# Patient Record
Sex: Male | Born: 1970 | Race: White | Hispanic: No | Marital: Single | Smoking: Current every day smoker
Health system: Southern US, Community
[De-identification: ages and names within clinical notes are randomized; demographics above are authoritative.]

## PROBLEM LIST (undated history)

## (undated) DIAGNOSIS — N179 Acute kidney failure, unspecified: Secondary | ICD-10-CM

## (undated) DIAGNOSIS — K7010 Alcoholic hepatitis without ascites: Secondary | ICD-10-CM

## (undated) DIAGNOSIS — R601 Generalized edema: Secondary | ICD-10-CM

## (undated) DIAGNOSIS — I4891 Unspecified atrial fibrillation: Secondary | ICD-10-CM

## (undated) HISTORY — PX: OTHER SURGICAL HISTORY: SHX169

---

## 1997-10-23 ENCOUNTER — Ambulatory Visit (HOSPITAL_COMMUNITY): Admission: RE | Admit: 1997-10-23 | Discharge: 1997-10-23 | Payer: Self-pay | Admitting: Family Medicine

## 1998-06-07 ENCOUNTER — Emergency Department (HOSPITAL_COMMUNITY): Admission: EM | Admit: 1998-06-07 | Discharge: 1998-06-07 | Payer: Self-pay | Admitting: Emergency Medicine

## 1998-06-07 ENCOUNTER — Encounter: Payer: Self-pay | Admitting: Emergency Medicine

## 1999-03-11 ENCOUNTER — Emergency Department (HOSPITAL_COMMUNITY): Admission: EM | Admit: 1999-03-11 | Discharge: 1999-03-11 | Payer: Self-pay | Admitting: *Deleted

## 2001-10-22 ENCOUNTER — Emergency Department (HOSPITAL_COMMUNITY): Admission: EM | Admit: 2001-10-22 | Discharge: 2001-10-22 | Payer: Self-pay | Admitting: Emergency Medicine

## 2002-04-12 ENCOUNTER — Emergency Department (HOSPITAL_COMMUNITY): Admission: EM | Admit: 2002-04-12 | Discharge: 2002-04-12 | Payer: Self-pay | Admitting: Emergency Medicine

## 2005-12-30 ENCOUNTER — Emergency Department (HOSPITAL_COMMUNITY): Admission: EM | Admit: 2005-12-30 | Discharge: 2005-12-30 | Payer: Self-pay | Admitting: Emergency Medicine

## 2006-11-09 ENCOUNTER — Emergency Department (HOSPITAL_COMMUNITY): Admission: EM | Admit: 2006-11-09 | Discharge: 2006-11-09 | Payer: Self-pay | Admitting: Emergency Medicine

## 2010-05-05 ENCOUNTER — Emergency Department (HOSPITAL_COMMUNITY)
Admission: EM | Admit: 2010-05-05 | Discharge: 2010-05-05 | Payer: Self-pay | Source: Home / Self Care | Admitting: Emergency Medicine

## 2010-07-15 LAB — POCT I-STAT, CHEM 8
Calcium, Ion: 0.82 mmol/L — ABNORMAL LOW (ref 1.12–1.32)
Creatinine, Ser: 1.1 mg/dL (ref 0.4–1.5)
Glucose, Bld: 100 mg/dL — ABNORMAL HIGH (ref 70–99)
Hemoglobin: 16.3 g/dL (ref 13.0–17.0)
TCO2: 18 mmol/L (ref 0–100)

## 2010-07-15 LAB — PROTIME-INR
INR: 0.99 (ref 0.00–1.49)
Prothrombin Time: 13.3 seconds (ref 11.6–15.2)

## 2010-07-15 LAB — COMPREHENSIVE METABOLIC PANEL
BUN: 10 mg/dL (ref 6–23)
Calcium: 8.8 mg/dL (ref 8.4–10.5)
Glucose, Bld: 102 mg/dL — ABNORMAL HIGH (ref 70–99)
Sodium: 138 mEq/L (ref 135–145)
Total Protein: 6.9 g/dL (ref 6.0–8.3)

## 2010-07-15 LAB — TYPE AND SCREEN: Antibody Screen: NEGATIVE

## 2010-07-15 LAB — URINALYSIS, ROUTINE W REFLEX MICROSCOPIC
Bilirubin Urine: NEGATIVE
Ketones, ur: NEGATIVE mg/dL
Protein, ur: NEGATIVE mg/dL
Urobilinogen, UA: 0.2 mg/dL (ref 0.0–1.0)

## 2010-07-15 LAB — CBC
HCT: 46.8 % (ref 39.0–52.0)
MCHC: 34.6 g/dL (ref 30.0–36.0)
MCV: 96.7 fL (ref 78.0–100.0)
Platelets: 237 10*3/uL (ref 150–400)
RDW: 13 % (ref 11.5–15.5)

## 2010-07-15 LAB — URINE MICROSCOPIC-ADD ON

## 2010-09-08 ENCOUNTER — Emergency Department (HOSPITAL_COMMUNITY)
Admission: EM | Admit: 2010-09-08 | Discharge: 2010-09-08 | Disposition: A | Discharge status: Home / Self Care | Payer: Self-pay | Attending: Emergency Medicine | Admitting: Emergency Medicine

## 2010-09-08 DIAGNOSIS — L0501 Pilonidal cyst with abscess: Secondary | ICD-10-CM | POA: Insufficient documentation

## 2011-02-18 LAB — DIFFERENTIAL
Basophils Absolute: 0
Lymphocytes Relative: 35
Monocytes Absolute: 0.5
Neutro Abs: 5.2

## 2011-02-18 LAB — POCT CARDIAC MARKERS
CKMB, poc: <1 — ABNORMAL LOW
Troponin i, poc: <0.05

## 2011-02-18 LAB — CBC
Hemoglobin: 16.5
RBC: 4.92
RDW: 14.4 — ABNORMAL HIGH

## 2011-02-18 LAB — ACETAMINOPHEN LEVEL: Acetaminophen (Tylenol), Serum: 12.4

## 2011-02-18 LAB — ETHANOL: Alcohol, Ethyl (B): 122 — ABNORMAL HIGH

## 2013-03-13 ENCOUNTER — Emergency Department (HOSPITAL_COMMUNITY)
Admission: EM | Admit: 2013-03-13 | Discharge: 2013-03-14 | Disposition: A | Discharge status: Home / Self Care | Payer: Self-pay | Attending: Emergency Medicine | Admitting: Emergency Medicine

## 2013-03-13 ENCOUNTER — Encounter (HOSPITAL_COMMUNITY): Payer: Self-pay | Admitting: Emergency Medicine

## 2013-03-13 DIAGNOSIS — F1110 Opioid abuse, uncomplicated: Secondary | ICD-10-CM | POA: Insufficient documentation

## 2013-03-13 DIAGNOSIS — F17200 Nicotine dependence, unspecified, uncomplicated: Secondary | ICD-10-CM | POA: Insufficient documentation

## 2013-03-13 DIAGNOSIS — I4891 Unspecified atrial fibrillation: Secondary | ICD-10-CM | POA: Insufficient documentation

## 2013-03-13 DIAGNOSIS — F1010 Alcohol abuse, uncomplicated: Secondary | ICD-10-CM | POA: Insufficient documentation

## 2013-03-13 DIAGNOSIS — F3289 Other specified depressive episodes: Secondary | ICD-10-CM | POA: Insufficient documentation

## 2013-03-13 DIAGNOSIS — F329 Major depressive disorder, single episode, unspecified: Secondary | ICD-10-CM | POA: Insufficient documentation

## 2013-03-13 DIAGNOSIS — F1910 Other psychoactive substance abuse, uncomplicated: Secondary | ICD-10-CM

## 2013-03-13 DIAGNOSIS — Z79899 Other long term (current) drug therapy: Secondary | ICD-10-CM | POA: Insufficient documentation

## 2013-03-13 HISTORY — DX: Unspecified atrial fibrillation: I48.91

## 2013-03-13 LAB — CBC
MCH: 37.2 pg — ABNORMAL HIGH (ref 26.0–34.0)
MCHC: 35.6 g/dL (ref 30.0–36.0)
Platelets: 193 10*3/uL (ref 150–400)
RBC: 4.84 MIL/uL (ref 4.22–5.81)
RDW: 15.3 % (ref 11.5–15.5)

## 2013-03-13 LAB — COMPREHENSIVE METABOLIC PANEL
ALT: 53 U/L (ref 0–53)
AST: 54 U/L — ABNORMAL HIGH (ref 0–37)
Albumin: 4.2 g/dL (ref 3.5–5.2)
Calcium: 9.9 mg/dL (ref 8.4–10.5)
Sodium: 136 mEq/L (ref 135–145)
Total Protein: 7.8 g/dL (ref 6.0–8.3)

## 2013-03-13 LAB — RAPID URINE DRUG SCREEN, HOSP PERFORMED
Amphetamines: NOT DETECTED
Cocaine: NOT DETECTED
Opiates: NOT DETECTED
Tetrahydrocannabinol: NOT DETECTED

## 2013-03-13 MED ORDER — HYDROXYZINE HCL 25 MG PO TABS: 25.0000 mg | ORAL_TABLET | Freq: Four times a day (QID) | ORAL | Status: DC | PRN

## 2013-03-13 MED ORDER — TRAZODONE HCL 50 MG PO TABS
50.0000 mg | ORAL_TABLET | Freq: Every evening | ORAL | Status: DC | PRN
  Administered 2013-03-13: 50 mg via ORAL
  Filled 2013-03-13: qty 1

## 2013-03-13 MED ORDER — LORAZEPAM 1 MG PO TABS: 0.0000 mg | ORAL_TABLET | Freq: Two times a day (BID) | ORAL | Status: DC

## 2013-03-13 MED ORDER — ONDANSETRON HCL 4 MG PO TABS: 4.0000 mg | ORAL_TABLET | Freq: Three times a day (TID) | ORAL | Status: DC | PRN

## 2013-03-13 MED ORDER — CHLORDIAZEPOXIDE HCL 25 MG PO CAPS
25.0000 mg | ORAL_CAPSULE | Freq: Once | ORAL | Status: AC
  Administered 2013-03-13: 25 mg via ORAL
  Filled 2013-03-13: qty 1

## 2013-03-13 MED ORDER — THIAMINE HCL 100 MG/ML IJ SOLN
100.0000 mg | Freq: Once | INTRAMUSCULAR | Status: AC
  Administered 2013-03-13: 100 mg via INTRAMUSCULAR
  Filled 2013-03-13: qty 2

## 2013-03-13 MED ORDER — ONDANSETRON 4 MG PO TBDP: 4.0000 mg | ORAL_TABLET | Freq: Four times a day (QID) | ORAL | Status: DC | PRN

## 2013-03-13 MED ORDER — IBUPROFEN 200 MG PO TABS: 600.0000 mg | ORAL_TABLET | Freq: Three times a day (TID) | ORAL | Status: DC | PRN

## 2013-03-13 MED ORDER — VITAMIN B-1 100 MG PO TABS
100.0000 mg | ORAL_TABLET | Freq: Every day | ORAL | Status: DC
  Administered 2013-03-14: 10:00:00 100 mg via ORAL
  Filled 2013-03-13: qty 1

## 2013-03-13 MED ORDER — CHLORDIAZEPOXIDE HCL 25 MG PO CAPS: 25.0000 mg | ORAL_CAPSULE | Freq: Four times a day (QID) | ORAL | Status: DC | PRN

## 2013-03-13 MED ORDER — SERTRALINE HCL 50 MG PO TABS
25.0000 mg | ORAL_TABLET | Freq: Every day | ORAL | Status: DC
  Administered 2013-03-13 – 2013-03-14 (×2): 25 mg via ORAL
  Filled 2013-03-13 (×2): qty 1

## 2013-03-13 MED ORDER — CHLORDIAZEPOXIDE HCL 25 MG PO CAPS
25.0000 mg | ORAL_CAPSULE | Freq: Three times a day (TID) | ORAL | Status: DC
  Filled 2013-03-13: qty 1

## 2013-03-13 MED ORDER — NICOTINE 21 MG/24HR TD PT24
21.0000 mg | MEDICATED_PATCH | Freq: Every day | TRANSDERMAL | Status: DC
  Administered 2013-03-13 – 2013-03-14 (×2): 21 mg via TRANSDERMAL
  Filled 2013-03-13 (×2): qty 1

## 2013-03-13 MED ORDER — METOPROLOL TARTRATE 25 MG PO TABS
50.0000 mg | ORAL_TABLET | Freq: Two times a day (BID) | ORAL | Status: DC
Start: 2013-03-13 — End: 2013-03-14
  Administered 2013-03-13 – 2013-03-14 (×2): 50 mg via ORAL
  Filled 2013-03-13 (×2): qty 2

## 2013-03-13 MED ORDER — CLONIDINE HCL 0.1 MG PO TABS
0.1000 mg | ORAL_TABLET | Freq: Four times a day (QID) | ORAL | Status: DC
  Administered 2013-03-13 – 2013-03-14 (×3): 0.1 mg via ORAL
  Filled 2013-03-13 (×3): qty 1

## 2013-03-13 MED ORDER — CHLORDIAZEPOXIDE HCL 25 MG PO CAPS: 25.0000 mg | ORAL_CAPSULE | ORAL | Status: DC

## 2013-03-13 MED ORDER — CLONIDINE HCL 0.1 MG PO TABS: 0.1000 mg | ORAL_TABLET | ORAL | Status: DC

## 2013-03-13 MED ORDER — LOPERAMIDE HCL 2 MG PO CAPS: 2.0000 mg | ORAL_CAPSULE | ORAL | Status: DC | PRN

## 2013-03-13 MED ORDER — CHLORDIAZEPOXIDE HCL 25 MG PO CAPS: 25.0000 mg | ORAL_CAPSULE | Freq: Every day | ORAL | Status: DC

## 2013-03-13 MED ORDER — LORAZEPAM 1 MG PO TABS
0.0000 mg | ORAL_TABLET | Freq: Four times a day (QID) | ORAL | Status: DC
  Administered 2013-03-13: 1 mg via ORAL
  Filled 2013-03-13: qty 1

## 2013-03-13 MED ORDER — CLONIDINE HCL 0.1 MG PO TABS: 0.1000 mg | ORAL_TABLET | Freq: Every day | ORAL | Status: DC

## 2013-03-13 MED ORDER — ACETAMINOPHEN 325 MG PO TABS: 650.0000 mg | ORAL_TABLET | ORAL | Status: DC | PRN

## 2013-03-13 MED ORDER — METHOCARBAMOL 500 MG PO TABS: 500.0000 mg | ORAL_TABLET | Freq: Three times a day (TID) | ORAL | Status: DC | PRN

## 2013-03-13 MED ORDER — ALUM & MAG HYDROXIDE-SIMETH 200-200-20 MG/5ML PO SUSP: 30.0000 mL | ORAL | Status: DC | PRN

## 2013-03-13 MED ORDER — CHLORDIAZEPOXIDE HCL 25 MG PO CAPS
25.0000 mg | ORAL_CAPSULE | Freq: Four times a day (QID) | ORAL | Status: AC
  Administered 2013-03-13 – 2013-03-14 (×4): 25 mg via ORAL
  Filled 2013-03-13 (×3): qty 1

## 2013-03-13 MED ORDER — ADULT MULTIVITAMIN W/MINERALS CH
1.0000 | ORAL_TABLET | Freq: Every day | ORAL | Status: DC
  Administered 2013-03-13 – 2013-03-14 (×2): 1 via ORAL
  Filled 2013-03-13 (×2): qty 1

## 2013-03-13 MED ORDER — NAPROXEN 500 MG PO TABS: 500.0000 mg | ORAL_TABLET | Freq: Two times a day (BID) | ORAL | Status: DC | PRN

## 2013-03-13 MED ORDER — DICYCLOMINE HCL 20 MG PO TABS: 20.0000 mg | ORAL_TABLET | Freq: Four times a day (QID) | ORAL | Status: DC | PRN

## 2013-03-13 NOTE — Consult Note (Signed)
  Subjective:  Patient states that he has depression related to his alcohol and pain killer (pill) use.  Patient states that he wants detox and then long term rehab treatment. Patient states that he has been drinking heavily for the last 6 years (one 12 pK beer and 1/2 fifth liquor a day and pain killers when ever he can get them off of the street daily).  Patient states that he last drink of alcohol was yesterday and pain killer today.  Patient denies suicidal/homicidal ideation, psychosis, and paranoia. States the only paranoia he has is being caught with "the stuff on me."  Patient states that he has never been in rehab, hospitalization, or outpatient serves for behavioral/psych behavior or illness.  Patient is living with his brother in  [place] and family is supportive.  Patient states that he was also on Zoloft once prescribed by his primary physician.  States that he has been off of the Zoloft for 6 months because he chose to stop taking it "but know I know that it was working and would like to restart it."   Psychiatric Specialty Exam: Physical Exam  Review of Systems  Constitutional: Positive for chills and diaphoresis.  Neurological: Positive for tremors and weakness.  Psychiatric/Behavioral: Positive for depression.    Blood pressure 112/62, pulse 70, temperature 98.7 F (37.1 C), temperature source Oral, resp. rate 16, SpO2 97.00%.There is no height or weight on file to calculate BMI.  General Appearance: Casual  Eye Contact::  Good  Speech:  Clear and Coherent and Normal Rate  Volume:  Normal  Mood:  Anxious and Depressed  Affect:  Congruent and Depressed  Thought Process:  Circumstantial and Goal Directed  Orientation:  Full (Time, Place, and Person)  Thought Content:  "I want to get off of the pills and alcohol"  Suicidal Thoughts:  No  Homicidal Thoughts:  No  Memory:  Immediate;   Good Recent;   Good  Judgement:  Impaired  Insight:  Fair  Psychomotor Activity:   Tremor and Diaphoresis, shakes, abdomen cramps, and fatigue  Concentration:  Fair  Recall:  Fair  Akathisia:  No  Handed:  Right  AIMS (if indicated):     Assets:  Communication Skills Desire for Improvement Housing Social Support Transportation  Sleep:      Face to face interview and consulted with Dr. Gilmore Laroche  Disposition:  Inpatient detox treatment.  Start Librium and Clonidine detox withdrawal protocol. Start Zoloft 25 mg daily. Monitor for safety and stabilization until inpatient bed is found.  Refer to long term rehab, CD IOP and give resources when patient is ready for discharge.  Shuvon B. Rankin FNP-BC Family Nurse Practitioner, Board Certified I agreed with the findings, treatment and disposition plan of this patient. Thresa Ross, MD

## 2013-03-13 NOTE — ED Provider Notes (Signed)
CSN: [id number]     Arrival date & time 03/13/13  1008 History   First MD Initiated Contact with Patient 03/13/13 1136     Chief Complaint  Patient presents with  . Addiction Problem   (Consider location/radiation/quality/duration/timing/severity/associated sxs/prior Treatment) The history is provided by the patient.   patient here requesting detox from opiates and alcohol. Admits to using Percocet and last used 2 days ago. He drinks a 12 pack of beer per day and last used yesterday. Denies any suicidal or homicidal ideations. No abdominal pain. No black or bloody stools. No command hallucinations. No chest pain or shortness of breath. Denies any prior history of detox  Past Medical History  Diagnosis Date  . Atrial fibrillation    Past Surgical History  Procedure Laterality Date  . Arm surgery     No family history on file. History  Substance Use Topics  . Smoking status: Current Every Day Smoker -- 1.00 packs/day    Types: Cigarettes  . Smokeless tobacco: Not on file  . Alcohol Use: 50.4 oz/week    84 Cans of beer per week     Comment: 12 pk per day    Review of Systems  All other systems reviewed and are negative.    Allergies  Review of patient's allergies indicates no known allergies.  Home Medications   Current Outpatient Rx  Name  Route  Sig  Dispense  Refill  . metoprolol (LOPRESSOR) 50 MG tablet   Oral   Take 50 mg by mouth 2 (two) times daily.          BP 115/79  Pulse 84  Temp(Src) 97.9 F (36.6 C) (Oral)  Resp 18  SpO2 95% Physical Exam  Nursing note and vitals reviewed. Constitutional: He is oriented to person, place, and time. He appears well-developed and well-nourished.  Non-toxic appearance. No distress.  HENT:  Head: Normocephalic and atraumatic.  Eyes: Conjunctivae, EOM and lids are normal. Pupils are equal, round, and reactive to light.  Neck: Normal range of motion. Neck supple. No tracheal deviation present. No mass present.   Cardiovascular: Normal rate, regular rhythm and normal heart sounds.  Exam reveals no gallop.   No murmur heard. Pulmonary/Chest: Effort normal and breath sounds normal. No stridor. No respiratory distress. He has no decreased breath sounds. He has no wheezes. He has no rhonchi. He has no rales.  Abdominal: Soft. Normal appearance and bowel sounds are normal. He exhibits no distension. There is no tenderness. There is no rebound and no CVA tenderness.  Musculoskeletal: Normal range of motion. He exhibits no edema and no tenderness.  Neurological: He is alert and oriented to person, place, and time. He has normal strength. No cranial nerve deficit or sensory deficit. GCS eye subscore is 4. GCS verbal subscore is 5. GCS motor subscore is 6.  Skin: Skin is warm and dry. No abrasion and no rash noted.  Psychiatric: He has a normal mood and affect. His speech is normal and behavior is normal. He expresses no suicidal plans and no homicidal plans.    ED Course  Procedures (including critical care time) Labs Review Labs Reviewed  ACETAMINOPHEN LEVEL  CBC  COMPREHENSIVE METABOLIC PANEL  ETHANOL  SALICYLATE LEVEL  URINE RAPID DRUG SCREEN (HOSP PERFORMED)   Imaging Review No results found.  EKG Interpretation   None       MDM  No diagnosis found. Patient to be medically cleared and then seen by behavior health hospital  Toy Baker, MD 03/13/13 [phone]

## 2013-03-13 NOTE — ED Notes (Addendum)
Pt states he has been using prescription pain medications on a daily basis for 3 years.  Pt states he uses oxycodone, vicodin and "pretty much anything I can get my hands on."  Pt doesn't have an rx for these meds.  Pt's last use of these medications was @ 2 am on Saturday.  Pt denies N/V/D, but endorses body aches/pains and nasal congestion.  Pt denies SI/HI.  Pt also states he uses alcohol daily. His last intake was Friday past (03/11/13).

## 2013-03-13 NOTE — ED Notes (Addendum)
Pt from home requesting detox from rx'd opiates. Pt reports that he takes approx 8 pills with ETOH. Pt last took pills/ETOH Friday. Pt is having stomach cramps, chills, loss of appetite, tremors. Pt has hx of afib and has not been taking his xarelto, but has been taking metoprolol to control rate.  Pt is A&O and in NAD

## 2013-03-13 NOTE — ED Notes (Signed)
Still unable to give report to floor. Will continue to check back.

## 2013-03-13 NOTE — ED Notes (Signed)
Pt. Is unable to provided any at this time. Will check back again

## 2013-03-13 NOTE — BH Assessment (Signed)
Assessment Note  Austin [name] is an 42 y.o. male.   Pt presents with father in ED with family in waiting room.  Pt has requested their help in getting detox from both alcohol and pain pills.  Pt primary drug is alcohol and has been using for many years on a daily basis.  Pt reports consuming 12 pk plus a half of a 5th of liquor daily.  Pt reports "I can remember the last time I went through a day not drinking."   Pt also reports that in 2011 he started taking pain pills.  "I will take whatever I can find.  Roxycotin, Oxycodone, OxyContin, Percocets, whatever. I average about 40 mg a day."    Pt denies suicidal, homicidal or audio-visual hallucination issues both presently and historically.  Pt father confirms.    Pt reports seizure hx about 2 or 3 years ago.  No Financial planner.    Pt does report depression.  When asked why he started using drugs pt responded "I am depressed.  I don't like myself and I feel better when I use."    MSE by TTS:    Pt O x 3, poor judgment, speech clear, restlessness, twitching in chair and rubbing legs and arms, light sensitive, anxious.  Pt was cooperative and polite.  He appeared in discomfort and displayed a lot of moving around in his chair but attempted to answer questions.   CIWA at 8 and COWS 4.    Recommendation  Pt has no prior hx of treatment.  Pt wants to change and has support from family to do so.  Father of pt reports no one else in family is an addict.  Pt wants to go through detox and then to rehab and then to optx services.  Pt has appeared to think through treatment process and family is supportive of pt recovery process.  Due to pt hx of seizure  [facility] and [facility] most likely won't accept pt.  [facility] appears to be an option for Pt.  Pt aware he will be in ED overnight and possible placed tomorrow.    Consulted with Dr. Freida Busman and he was in agreement with plan to assist pt and to monitor alcohol detox while in ED until pt can be placed in an  appropriate treatment facility.  Psychiatry will round.  Axis I: Alcohol Abuse, Major Depression, Recurrent severe and Substance Abuse Axis II: Deferred Axis III:  Past Medical History  Diagnosis Date  . Atrial fibrillation    Axis IV: occupational problems, other psychosocial or environmental problems, problems related to social environment and problems with primary support group Axis V: 41-50 serious symptoms  Past Medical History:  Past Medical History  Diagnosis Date  . Atrial fibrillation     Past Surgical History  Procedure Laterality Date  . Arm surgery      Family History: No family history on file.  Social History:  reports that he has been smoking Cigarettes.  He has been smoking about 1.00 pack per day. He does not have any smokeless tobacco history on file. He reports that he drinks about 50.4 ounces of alcohol per week. He reports that he does not use illicit drugs.  Additional Social History:  Alcohol / Drug Use Pain Medications: na Prescriptions: na Over the Counter: na History of alcohol / drug use?: Yes Longest period of sobriety (when/how long): 4 days off pills and 0 days off alcohol Negative Consequences of Use: Financial;Personal relationships;Work / Regulatory affairs officer  1 Name of Substance 1: alcohol 1 - Age of First Use: teen 1 - Amount (size/oz): 12 pk and half a 5th 1 - Frequency: daily 1 - Duration: years - over 5 1 - Last Use / Amount: 03-12-13 Substance #2 Name of Substance 2: pain pills  2 - Age of First Use: 2011 - 39 2 - Amount (size/oz): varies - 40 to 60 mg per day (percocets, oxycotin, roxycotin, oxycndone -"whatever I can get" 2 - Frequency: daily 2 - Duration: 3 years 2 - Last Use / Amount: 03-12-13  CIWA: CIWA-Ar BP: 115/79 mmHg Pulse Rate: 84 Nausea and Vomiting: mild nausea with no vomiting Tactile Disturbances: very mild itching, pins and needles, burning or numbness Tremor: not visible, but can be felt fingertip to  fingertip Auditory Disturbances: not present Paroxysmal Sweats: no sweat visible Visual Disturbances: very mild sensitivity Anxiety: two Headache, Fullness in Head: very mild Agitation: somewhat more than normal activity Orientation and Clouding of Sensorium: oriented and can do serial additions CIWA-Ar Total: 8 COWS: Clinical Opiate Withdrawal Scale (COWS) Resting Pulse Rate: Pulse Rate 81-100 Sweating: No report of chills or flushing Restlessness: Reports difficulty sitting still, but is able to do so Pupil Size: Pupils pinned or normal size for room light Bone or Joint Aches: Mild diffuse discomfort Runny Nose or Tearing: Not present GI Upset: No GI symptoms Tremor: Tremor can be felt, but not observed Yawning: No yawning Anxiety or Irritability: Patient obviously irritable/anxious Gooseflesh Skin: Skin is smooth COWS Total Score: 6  Allergies: No Known Allergies  Home Medications:  (Not in a hospital admission)  OB/GYN Status:  No LMP for male patient.  General Assessment Data Location of Assessment: [facility] ED Is this a Tele or Face-to-Face Assessment?: Face-to-Face Is this an Initial Assessment or a Re-assessment for this encounter?: Initial Assessment Living Arrangements: Alone Can pt return to current living arrangement?: Yes Admission Status: Voluntary Is patient capable of signing voluntary admission?: Yes Transfer from: Acute Hospital Referral Source: MD  Medical Screening Exam [facility] Walk-in ONLY) Medical Exam completed: Yes  [facility] Crisis Care Plan Living Arrangements: Alone  Education Status Is patient currently in school?: No Current Grade: na Highest grade of school patient has completed: na Name of school: na Contact person: na  Risk to self Suicidal Ideation: No Suicidal Intent: No Is patient at risk for suicide?: No Suicidal Plan?: No Access to Means: Yes Specify Access to Suicidal Means: SA related issues What has been your use of drugs/alcohol  within the last 12 months?: alcohol and pain pills Previous Attempts/Gestures: No How many times?: 0 Other Self Harm Risks: na Triggers for Past Attempts: None known Intentional Self Injurious Behavior: None Family Suicide History: No Recent stressful life event(s): Other (Comment) (SA issues) Persecutory voices/beliefs?: No Depression: Yes Depression Symptoms: Isolating;Fatigue;Guilt;Loss of interest in usual pleasures;Feeling worthless/self pity;Feeling angry/irritable Substance abuse history and/or treatment for substance abuse?: No Suicide prevention information given to non-admitted patients: Not applicable  Risk to Others Homicidal Ideation: No Thoughts of Harm to Others: No Current Homicidal Intent: No Current Homicidal Plan: No Access to Homicidal Means: No Identified Victim: na History of harm to others?: No Assessment of Violence: None Noted Violent Behavior Description: cooperative Does patient have access to weapons?: No Criminal Charges Pending?: No Does patient have a court date: No  Psychosis Hallucinations: None noted Delusions: None noted  Mental Status Report Appear/Hygiene: Disheveled Eye Contact: Fair Motor Activity: Restlessness Speech: Logical/coherent Level of Consciousness: Alert Mood: Depressed;Anxious;Irritable;Preoccupied;Sad;Worthless, low self-esteem;Ashamed/humiliated Affect: Anxious;Appropriate  to circumstance;Depressed;Irritable;Sad Anxiety Level: Severe Thought Processes: Coherent Judgement: Impaired Orientation: Person;Place;Situation;Appropriate for developmental age Obsessive Compulsive Thoughts/Behaviors: None  Cognitive Functioning Concentration: Decreased Memory: Recent Intact;Remote Intact IQ: Average Insight: Poor Impulse Control: Poor Appetite: Fair Weight Loss: 0 Weight Gain: 0 Sleep: Decreased Total Hours of Sleep: 4 Vegetative Symptoms: None  ADLScreening [facility] Assessment Services) Patient's cognitive ability  adequate to safely complete daily activities?: Yes Patient able to express need for assistance with ADLs?: Yes Independently performs ADLs?: Yes (appropriate for developmental age)  Prior Inpatient Therapy Prior Inpatient Therapy: No Prior Therapy Dates: na Prior Therapy Facilty/Provider(s): na Reason for Treatment: na  Prior Outpatient Therapy Prior Outpatient Therapy: No Prior Therapy Dates: na Prior Therapy Facilty/Provider(s): na Reason for Treatment: na  ADL Screening (condition at time of admission) Patient's cognitive ability adequate to safely complete daily activities?: Yes Is the patient deaf or have difficulty hearing?: No Does the patient have difficulty seeing, even when wearing glasses/contacts?: No Does the patient have difficulty concentrating, remembering, or making decisions?: No Patient able to express need for assistance with ADLs?: Yes Does the patient have difficulty dressing or bathing?: No Independently performs ADLs?: Yes (appropriate for developmental age) Does the patient have difficulty walking or climbing stairs?: No Weakness of Legs: None Weakness of Arms/Hands: None  Home Assistive Devices/Equipment Home Assistive Devices/Equipment: None  Therapy Consults (therapy consults require a physician order) PT Evaluation Needed: No OT Evalulation Needed: No SLP Evaluation Needed: No Abuse/Neglect Assessment (Assessment to be complete while patient is alone) Physical Abuse: Denies Verbal Abuse: Denies Sexual Abuse: Denies Exploitation of patient/patient's resources: Denies Self-Neglect: Denies Values / Beliefs Cultural Requests During Hospitalization: None Spiritual Requests During Hospitalization: None Consults Spiritual Care Consult Needed: No Social Work Consult Needed: No Merchant navy officer (For Healthcare) Advance Directive: Patient does not have advance directive Pre-existing out of facility DNR order (yellow form or pink MOST form): No     Additional Information 1:1 In Past 12 Months?: No CIRT Risk: No Elopement Risk: No Does patient have medical clearance?: No (pt detoxing and ED to [facility] )     Disposition:  Disposition Initial Assessment Completed for this Encounter: Yes Disposition of Patient: Inpatient treatment program Type of inpatient treatment program: Adult  On Site Evaluation by:   Reviewed with Physician:    Titus Mould, Eppie Gibson 03/13/2013 12:20 PM

## 2013-03-13 NOTE — ED Notes (Signed)
Per previous nurse, Nelly Rout, RN, Metoprolol was not given as scheduled because patient was not available until much later and Blood pressures were stable.

## 2013-03-13 NOTE — ED Notes (Signed)
Tried again to give report to Psych ED staff. Nurse is not on the floor to take report.

## 2013-03-13 NOTE — ED Notes (Addendum)
Called to give report to psych ED. Told to call back in 20 minutes. Pt and family updated on plan.

## 2013-03-13 NOTE — ED Notes (Signed)
Patient belongings placed in locker 30. 1 white personal belongings bag placed in locker 30

## 2013-03-13 NOTE — ED Notes (Signed)
Patient presents pleasant and cooperative. Denies any c/o pain; denies any suicidal or homicidal thoughts or any auditory or visual hallucinations.

## 2013-03-14 DIAGNOSIS — F1010 Alcohol abuse, uncomplicated: Secondary | ICD-10-CM

## 2013-03-14 DIAGNOSIS — F1110 Opioid abuse, uncomplicated: Secondary | ICD-10-CM

## 2013-03-14 NOTE — Consult Note (Signed)
  Pt was seen with NP Julieanne Cotton and dad present. Pt denies a history of withdrawal seizures or DTs. Pt reports a history of addictions to opioids and alcohol, and want treatment for these issues. Pt reports a history of chronic tremors and Afib. Pt reports having 1 seizure 4-5 years ago, after taking some sleeping pills with alcohol. At the time, he reports losing consciousness (with muscle twitching and eyes rolling back), so was taken to [facility] ED and discharged. No current diagnosis of epilepsy, and he does not see a neurologist. No antiepileptic treatment. Pt denies SI/HI/AVH.  Psychiatric Specialty Exam: Physical Exam  ROS  Blood pressure 100/71, pulse 75, temperature 97.9 F (36.6 C), temperature source Oral, resp. rate 18, SpO2 95.00%.There is no height or weight on file to calculate BMI.  General Appearance: Fairly Groomed  Patent attorney::  Fair  Speech:  Normal Rate  Volume:  Normal  Mood:  Euthymic  Affect:  Appropriate and Congruent  Thought Process:  Goal Directed  Orientation:  Full (Time, Place, and Person)  Thought Content:  Hallucinations: None  Suicidal Thoughts:  No  Homicidal Thoughts:  No  Memory:  Negative  Judgement:  Fair  Insight:  Fair  Psychomotor Activity:  Normal  Concentration:  Fair  Recall:  Fair  Akathisia:  Negative  Handed:  Right  AIMS (if indicated):     Assets:  Desire for Improvement  Sleep:      Diagnosis: unchanged  Plan: Pt denies current withdrawal symptoms. No history of complicated withdrawal. No acute safety concerns. Will discharge pt to home. He agrees to follow up at [facility] for rehab.

## 2013-03-14 NOTE — [facility] Counselor (Signed)
Per Harriett Sine RN at [facility], pt wouldn't be excuded from possible admission based on his having had one seizures 4 years ago. Harriett Sine says that as long as a patient hasn't had recent seizures, the pt would be considered for admission.  Evette Cristal, [place] Assessment Counselor

## 2013-03-14 NOTE — ED Notes (Addendum)
Pt is awake and alert, pleasant and cooperative. Patient denies HI, SI AH or VH. Discharge vitals 106/75 HR 81 RR 20 and unlabored. Pt has  To follow up with out outpatient treatment. Will continue to monitor for safety. Patient escorted to lobby without incident. T.Melvyn Neth RN

## 2013-03-14 NOTE — Progress Notes (Signed)
[facility] provided pt with a [employer] Orange Card application, highlighting [facility].  °

## 2015-12-04 ENCOUNTER — Inpatient Hospital Stay (HOSPITAL_COMMUNITY)
Admission: EM | Admit: 2015-12-04 | Discharge: 2015-12-17 | DRG: 441 | Disposition: A | Discharge status: Hospice | Payer: Medicaid Other | Attending: Family Medicine | Admitting: Family Medicine

## 2015-12-04 ENCOUNTER — Encounter (HOSPITAL_COMMUNITY): Payer: Self-pay | Admitting: Emergency Medicine

## 2015-12-04 ENCOUNTER — Emergency Department (HOSPITAL_COMMUNITY): Payer: Medicaid Other

## 2015-12-04 DIAGNOSIS — R609 Edema, unspecified: Secondary | ICD-10-CM

## 2015-12-04 DIAGNOSIS — Z9119 Patient's noncompliance with other medical treatment and regimen: Secondary | ICD-10-CM

## 2015-12-04 DIAGNOSIS — K766 Portal hypertension: Secondary | ICD-10-CM | POA: Diagnosis present

## 2015-12-04 DIAGNOSIS — K7011 Alcoholic hepatitis with ascites: Secondary | ICD-10-CM | POA: Diagnosis not present

## 2015-12-04 DIAGNOSIS — M25561 Pain in right knee: Secondary | ICD-10-CM

## 2015-12-04 DIAGNOSIS — J9601 Acute respiratory failure with hypoxia: Secondary | ICD-10-CM | POA: Diagnosis not present

## 2015-12-04 DIAGNOSIS — N184 Chronic kidney disease, stage 4 (severe): Secondary | ICD-10-CM | POA: Diagnosis present

## 2015-12-04 DIAGNOSIS — K709 Alcoholic liver disease, unspecified: Secondary | ICD-10-CM | POA: Diagnosis present

## 2015-12-04 DIAGNOSIS — I5023 Acute on chronic systolic (congestive) heart failure: Secondary | ICD-10-CM | POA: Diagnosis not present

## 2015-12-04 DIAGNOSIS — R601 Generalized edema: Secondary | ICD-10-CM | POA: Diagnosis present

## 2015-12-04 DIAGNOSIS — Z66 Do not resuscitate: Secondary | ICD-10-CM | POA: Diagnosis not present

## 2015-12-04 DIAGNOSIS — D689 Coagulation defect, unspecified: Secondary | ICD-10-CM | POA: Diagnosis present

## 2015-12-04 DIAGNOSIS — N179 Acute kidney failure, unspecified: Secondary | ICD-10-CM | POA: Diagnosis not present

## 2015-12-04 DIAGNOSIS — R17 Unspecified jaundice: Secondary | ICD-10-CM | POA: Diagnosis not present

## 2015-12-04 DIAGNOSIS — Z6825 Body mass index (BMI) 25.0-25.9, adult: Secondary | ICD-10-CM | POA: Diagnosis not present

## 2015-12-04 DIAGNOSIS — M25551 Pain in right hip: Secondary | ICD-10-CM | POA: Diagnosis not present

## 2015-12-04 DIAGNOSIS — W19XXXA Unspecified fall, initial encounter: Secondary | ICD-10-CM

## 2015-12-04 DIAGNOSIS — K7290 Hepatic failure, unspecified without coma: Secondary | ICD-10-CM | POA: Diagnosis present

## 2015-12-04 DIAGNOSIS — E876 Hypokalemia: Secondary | ICD-10-CM | POA: Diagnosis not present

## 2015-12-04 DIAGNOSIS — M25562 Pain in left knee: Secondary | ICD-10-CM

## 2015-12-04 DIAGNOSIS — R630 Anorexia: Secondary | ICD-10-CM | POA: Diagnosis present

## 2015-12-04 DIAGNOSIS — R945 Abnormal results of liver function studies: Secondary | ICD-10-CM

## 2015-12-04 DIAGNOSIS — Z7189 Other specified counseling: Secondary | ICD-10-CM | POA: Diagnosis not present

## 2015-12-04 DIAGNOSIS — I2510 Atherosclerotic heart disease of native coronary artery without angina pectoris: Secondary | ICD-10-CM | POA: Diagnosis present

## 2015-12-04 DIAGNOSIS — I480 Paroxysmal atrial fibrillation: Secondary | ICD-10-CM | POA: Diagnosis not present

## 2015-12-04 DIAGNOSIS — I481 Persistent atrial fibrillation: Secondary | ICD-10-CM | POA: Diagnosis not present

## 2015-12-04 DIAGNOSIS — Z515 Encounter for palliative care: Secondary | ICD-10-CM

## 2015-12-04 DIAGNOSIS — E871 Hypo-osmolality and hyponatremia: Secondary | ICD-10-CM | POA: Diagnosis not present

## 2015-12-04 DIAGNOSIS — R0602 Shortness of breath: Secondary | ICD-10-CM | POA: Diagnosis not present

## 2015-12-04 DIAGNOSIS — M25552 Pain in left hip: Secondary | ICD-10-CM | POA: Diagnosis not present

## 2015-12-04 DIAGNOSIS — F411 Generalized anxiety disorder: Secondary | ICD-10-CM

## 2015-12-04 DIAGNOSIS — K7031 Alcoholic cirrhosis of liver with ascites: Secondary | ICD-10-CM | POA: Diagnosis present

## 2015-12-04 DIAGNOSIS — Y92009 Unspecified place in unspecified non-institutional (private) residence as the place of occurrence of the external cause: Secondary | ICD-10-CM

## 2015-12-04 DIAGNOSIS — I272 Other secondary pulmonary hypertension: Secondary | ICD-10-CM | POA: Diagnosis present

## 2015-12-04 DIAGNOSIS — B370 Candidal stomatitis: Secondary | ICD-10-CM

## 2015-12-04 DIAGNOSIS — I4891 Unspecified atrial fibrillation: Secondary | ICD-10-CM | POA: Diagnosis not present

## 2015-12-04 DIAGNOSIS — F1010 Alcohol abuse, uncomplicated: Secondary | ICD-10-CM | POA: Diagnosis present

## 2015-12-04 DIAGNOSIS — Z79899 Other long term (current) drug therapy: Secondary | ICD-10-CM

## 2015-12-04 DIAGNOSIS — R809 Proteinuria, unspecified: Secondary | ICD-10-CM | POA: Diagnosis present

## 2015-12-04 DIAGNOSIS — N5089 Other specified disorders of the male genital organs: Secondary | ICD-10-CM | POA: Diagnosis not present

## 2015-12-04 DIAGNOSIS — K767 Hepatorenal syndrome: Principal | ICD-10-CM | POA: Diagnosis present

## 2015-12-04 DIAGNOSIS — K5901 Slow transit constipation: Secondary | ICD-10-CM

## 2015-12-04 DIAGNOSIS — Z91120 Patient's intentional underdosing of medication regimen due to financial hardship: Secondary | ICD-10-CM

## 2015-12-04 DIAGNOSIS — E872 Acidosis, unspecified: Secondary | ICD-10-CM

## 2015-12-04 DIAGNOSIS — T45516A Underdosing of anticoagulants, initial encounter: Secondary | ICD-10-CM | POA: Diagnosis present

## 2015-12-04 DIAGNOSIS — R7989 Other specified abnormal findings of blood chemistry: Secondary | ICD-10-CM

## 2015-12-04 DIAGNOSIS — K219 Gastro-esophageal reflux disease without esophagitis: Secondary | ICD-10-CM | POA: Diagnosis not present

## 2015-12-04 DIAGNOSIS — Z880 Allergy status to penicillin: Secondary | ICD-10-CM

## 2015-12-04 DIAGNOSIS — L8990 Pressure ulcer of unspecified site, unspecified stage: Secondary | ICD-10-CM | POA: Insufficient documentation

## 2015-12-04 DIAGNOSIS — F17210 Nicotine dependence, cigarettes, uncomplicated: Secondary | ICD-10-CM | POA: Diagnosis present

## 2015-12-04 DIAGNOSIS — R570 Cardiogenic shock: Secondary | ICD-10-CM

## 2015-12-04 DIAGNOSIS — L89300 Pressure ulcer of unspecified buttock, unstageable: Secondary | ICD-10-CM

## 2015-12-04 DIAGNOSIS — D696 Thrombocytopenia, unspecified: Secondary | ICD-10-CM | POA: Diagnosis present

## 2015-12-04 DIAGNOSIS — I4510 Unspecified right bundle-branch block: Secondary | ICD-10-CM | POA: Diagnosis present

## 2015-12-04 DIAGNOSIS — E869 Volume depletion, unspecified: Secondary | ICD-10-CM | POA: Diagnosis not present

## 2015-12-04 HISTORY — DX: Acute kidney failure, unspecified: N17.9

## 2015-12-04 HISTORY — DX: Generalized edema: R60.1

## 2015-12-04 HISTORY — DX: Alcoholic hepatitis without ascites: K70.10

## 2015-12-04 LAB — COMPREHENSIVE METABOLIC PANEL
ALT: 27 U/L (ref 17–63)
AST: 57 U/L — ABNORMAL HIGH (ref 15–41)
Albumin: 3 g/dL — ABNORMAL LOW (ref 3.5–5.0)
Alkaline Phosphatase: 99 U/L (ref 38–126)
Anion gap: 23 — ABNORMAL HIGH (ref 5–15)
BUN: 49 mg/dL — ABNORMAL HIGH (ref 6–20)
CHLORIDE: 78 mmol/L — AB (ref 101–111)
CO2: 24 mmol/L (ref 22–32)
CREATININE: 2.61 mg/dL — AB (ref 0.61–1.24)
Calcium: 8.5 mg/dL — ABNORMAL LOW (ref 8.9–10.3)
GFR calc Af Amer: 32 mL/min — ABNORMAL LOW (ref >60)
GFR, EST NON AFRICAN AMERICAN: 28 mL/min — AB (ref >60)
Glucose, Bld: 116 mg/dL — ABNORMAL HIGH (ref 65–99)
Potassium: 3.5 mmol/L (ref 3.5–5.1)
Sodium: 125 mmol/L — ABNORMAL LOW (ref 135–145)
Total Bilirubin: 12.2 mg/dL — ABNORMAL HIGH (ref 0.3–1.2)
Total Protein: 6.5 g/dL (ref 6.5–8.1)

## 2015-12-04 LAB — URINALYSIS, ROUTINE W REFLEX MICROSCOPIC
GLUCOSE, UA: NEGATIVE mg/dL
Hgb urine dipstick: NEGATIVE
Ketones, ur: 15 mg/dL — AB
NITRITE: POSITIVE — AB
PROTEIN: 100 mg/dL — AB
Specific Gravity, Urine: 1.022 (ref 1.005–1.030)
pH: 5 (ref 5.0–8.0)

## 2015-12-04 LAB — URINE MICROSCOPIC-ADD ON

## 2015-12-04 LAB — PROTIME-INR
INR: 1.99
Prothrombin Time: 22.9 seconds — ABNORMAL HIGH (ref 11.4–15.2)

## 2015-12-04 LAB — LIPASE, BLOOD: LIPASE: 18 U/L (ref 11–51)

## 2015-12-04 LAB — CBC
HCT: 41.2 % (ref 39.0–52.0)
Hemoglobin: 14.6 g/dL (ref 13.0–17.0)
MCH: 40.1 pg — ABNORMAL HIGH (ref 26.0–34.0)
MCHC: 35.4 g/dL (ref 30.0–36.0)
MCV: 113.2 fL — AB (ref 78.0–100.0)
PLATELETS: 134 10*3/uL — AB (ref 150–400)
RBC: 3.64 MIL/uL — AB (ref 4.22–5.81)
RDW: 15.6 % — ABNORMAL HIGH (ref 11.5–15.5)
WBC: 10.3 10*3/uL (ref 4.0–10.5)

## 2015-12-04 MED ORDER — FUROSEMIDE 10 MG/ML IJ SOLN
40.0000 mg | Freq: Once | INTRAMUSCULAR | Status: AC
  Administered 2015-12-04: 40 mg via INTRAVENOUS
  Filled 2015-12-04: qty 4

## 2015-12-04 MED ORDER — DILTIAZEM HCL 25 MG/5ML IV SOLN: 10.0000 mg | Freq: Once | INTRAVENOUS | Status: DC

## 2015-12-04 MED ORDER — FOLIC ACID 1 MG PO TABS
1.0000 mg | ORAL_TABLET | Freq: Every day | ORAL | Status: DC
Start: 2015-12-05 — End: 2015-12-11
  Administered 2015-12-05 – 2015-12-10 (×6): 1 mg via ORAL
  Filled 2015-12-04 (×7): qty 1

## 2015-12-04 MED ORDER — DIATRIZOATE MEGLUMINE & SODIUM 66-10 % PO SOLN
ORAL | Status: AC
  Filled 2015-12-04: qty 30

## 2015-12-04 MED ORDER — LORAZEPAM 1 MG PO TABS
0.0000 mg | ORAL_TABLET | Freq: Two times a day (BID) | ORAL | Status: AC
  Administered 2015-12-07: 1 mg via ORAL
  Administered 2015-12-07: 4 mg via ORAL
  Administered 2015-12-08 (×2): 1 mg via ORAL
  Filled 2015-12-04: qty 1
  Filled 2015-12-04: qty 2
  Filled 2015-12-04: qty 1
  Filled 2015-12-04: qty 4

## 2015-12-04 MED ORDER — ONDANSETRON HCL 4 MG/2ML IJ SOLN: 4.0000 mg | Freq: Four times a day (QID) | INTRAMUSCULAR | Status: DC | PRN

## 2015-12-04 MED ORDER — LORAZEPAM 1 MG PO TABS
0.0000 mg | ORAL_TABLET | Freq: Four times a day (QID) | ORAL | Status: AC
  Administered 2015-12-06: 1 mg via ORAL
  Filled 2015-12-04: qty 1

## 2015-12-04 MED ORDER — LORAZEPAM 2 MG/ML IJ SOLN: 1.0000 mg | Freq: Four times a day (QID) | INTRAMUSCULAR | Status: AC | PRN

## 2015-12-04 MED ORDER — SODIUM CHLORIDE 0.9 % IV BOLUS (SEPSIS)
500.0000 mL | Freq: Once | INTRAVENOUS | Status: AC
  Administered 2015-12-04: 500 mL via INTRAVENOUS

## 2015-12-04 MED ORDER — VITAMIN B-1 100 MG PO TABS
100.0000 mg | ORAL_TABLET | Freq: Every day | ORAL | Status: DC
  Administered 2015-12-05 – 2015-12-10 (×5): 100 mg via ORAL
  Filled 2015-12-04 (×5): qty 1

## 2015-12-04 MED ORDER — ONDANSETRON HCL 4 MG PO TABS
4.0000 mg | ORAL_TABLET | Freq: Four times a day (QID) | ORAL | Status: DC | PRN
  Administered 2015-12-09: 4 mg via ORAL
  Filled 2015-12-04: qty 1

## 2015-12-04 MED ORDER — ADULT MULTIVITAMIN W/MINERALS CH
1.0000 | ORAL_TABLET | Freq: Every day | ORAL | Status: DC
  Administered 2015-12-05 – 2015-12-10 (×5): 1 via ORAL
  Filled 2015-12-04 (×5): qty 1

## 2015-12-04 MED ORDER — SODIUM CHLORIDE 0.9 % IV SOLN
Freq: Once | INTRAVENOUS | Status: AC
  Administered 2015-12-04: 19:00:00 via INTRAVENOUS

## 2015-12-04 MED ORDER — LORAZEPAM 1 MG PO TABS: 1.0000 mg | ORAL_TABLET | Freq: Four times a day (QID) | ORAL | Status: AC | PRN

## 2015-12-04 MED ORDER — THIAMINE HCL 100 MG/ML IJ SOLN
100.0000 mg | Freq: Every day | INTRAMUSCULAR | Status: DC
Start: 2015-12-05 — End: 2015-12-11
  Administered 2015-12-08 – 2015-12-11 (×2): 100 mg via INTRAVENOUS
  Filled 2015-12-04 (×2): qty 2

## 2015-12-04 NOTE — Consult Note (Addendum)
Referring Physician: ER provider  Reason for Consultation: AF with RVR  HPI: 45 yo [place] man with ongoing alcohol abuse (until 2 days ago) and history of AF treated with warfarin and Dilt four years ago, presents today with worsening swelling in his legs for the pasta couple weeks. Associated with palpitations, fatigue, loss of appetite, jaundice. Denies fever, chills, N/V, diarrhea, bleeding, orthopnea, PND, syncope, CP. Not on any regular meds these days. Takes Advil prn. Denies illicit drug use. Reports that he used to see a cardiologist in [place] but stopped that due to loss of job and insurance.    Review of Systems:  10 systems reviewed unremarkable except as noted in HPI    Past Medical History:  Diagnosis Date  . Atrial fibrillation (HCC)     No prescriptions prior to admission.     . diatrizoate meglumine-sodium        Infusions:    Allergies  Allergen Reactions  . Penicillins     Has patient had a PCN reaction causing immediate rash, facial/tongue/throat swelling, SOB or lightheadedness with hypotension: YES Has patient had a PCN reaction causing severe rash involving mucus membranes or skin necrosis: NO Has patient had a PCN reaction that required hospitalization NO Has patient had a PCN reaction occurring within the last 10 years: NO If all of the above answers are "NO", then may proceed with Cephalosporin use.    Social History   Social History  . Marital status: Single    Spouse name: N/A  . Number of children: N/A  . Years of education: N/A   Occupational History  . Not on file.   Social History Main Topics  . Smoking status: Current Every Day Smoker    Packs/day: 1.00    Types: Cigarettes  . Smokeless tobacco: Not on file  . Alcohol use 50.4 oz/week    84 Cans of beer per week     Comment: 12 pk per day  . Drug use: No  . Sexual activity: Not on file   Other Topics Concern  . Not on file   Social History Narrative  . No narrative on  file    Home Meds: prn Advil  History reviewed. No pertinent family history.  PHYSICAL EXAM: Vitals:   12/04/15 2136 12/04/15 2217  BP: 92/81 93/76  Pulse: 86 (!) 134  Resp: 19 18  Temp:  97.7 F (36.5 C)    No intake or output data in the 24 hours ending 12/04/15 2236  General:  Ill appearing, jaundice, No respiratory difficulty at rest HEENT: AT, , EOMI,  Neck: supple; +JVD. Carotids 2+ bilat; no bruits. No lymphadenopathy or thryomegaly appreciated. Cor: PMI displaced. Irregular rate & rhythm. +gallops; + 2/6 syst reg murmur LLS border and apex Lungs: mild basal crackles Abdomen: RUQ tender, distended. No bruits or masses. Good bowel sounds. Extremities: no cyanosis, clubbing; +3 edema up to thighs Neuro: alert & oriented x 3, cranial nerves grossly intact. moves all 4 extremities w/o difficulty. Affect pleasant.  ECG: AF 141 bpm, RBBB  Results for orders placed or performed during the hospital encounter of 12/04/15 (from the past 24 hour(s))  Lipase, blood     Status: None   Collection Time: 12/04/15  1:55 PM  Result Value Ref Range   Lipase 18 11 - 51 U/L  Comprehensive metabolic panel     Status: Abnormal   Collection Time: 12/04/15  1:55 PM  Result Value Ref Range   Sodium  125 (L) 135 - 145 mmol/L   Potassium 3.5 3.5 - 5.1 mmol/L   Chloride 78 (L) 101 - 111 mmol/L   CO2 24 22 - 32 mmol/L   Glucose, Bld 116 (H) 65 - 99 mg/dL   BUN 49 (H) 6 - 20 mg/dL   Creatinine, Ser 4.08 (H) 0.61 - 1.24 mg/dL   Calcium 8.5 (L) 8.9 - 10.3 mg/dL   Total Protein 6.5 6.5 - 8.1 g/dL   Albumin 3.0 (L) 3.5 - 5.0 g/dL   AST 57 (H) 15 - 41 U/L   ALT 27 17 - 63 U/L   Alkaline Phosphatase 99 38 - 126 U/L   Total Bilirubin 12.2 (H) 0.3 - 1.2 mg/dL   GFR calc non Af Amer 28 (L) >60 mL/min   GFR calc Af Amer 32 (L) >60 mL/min   Anion gap 23 (H) 5 - 15  CBC     Status: Abnormal   Collection Time: 12/04/15  1:55 PM  Result Value Ref Range   WBC 10.3 4.0 - 10.5 K/uL   RBC 3.64 (L)  4.22 - 5.81 MIL/uL   Hemoglobin 14.6 13.0 - 17.0 g/dL   HCT 14.4 81.8 - 56.3 %   MCV 113.2 (H) 78.0 - 100.0 fL   MCH 40.1 (H) 26.0 - 34.0 pg   MCHC 35.4 30.0 - 36.0 g/dL   RDW 14.9 (H) 70.2 - 63.7 %   Platelets 134 (L) 150 - 400 K/uL  Urinalysis, Routine w reflex microscopic     Status: Abnormal   Collection Time: 12/04/15  1:57 PM  Result Value Ref Range   Color, Urine ORANGE (A) YELLOW   APPearance CLOUDY (A) CLEAR   Specific Gravity, Urine 1.022 1.005 - 1.030   pH 5.0 5.0 - 8.0   Glucose, UA NEGATIVE NEGATIVE mg/dL   Hgb urine dipstick NEGATIVE NEGATIVE   Bilirubin Urine LARGE (A) NEGATIVE   Ketones, ur 15 (A) NEGATIVE mg/dL   Protein, ur 858 (A) NEGATIVE mg/dL   Nitrite POSITIVE (A) NEGATIVE   Leukocytes, UA SMALL (A) NEGATIVE  Urine microscopic-add on     Status: Abnormal   Collection Time: 12/04/15  1:57 PM  Result Value Ref Range   Squamous Epithelial / LPF 6-30 (A) NONE SEEN   WBC, UA 6-30 0 - 5 WBC/hpf   RBC / HPF 0-5 0 - 5 RBC/hpf   Bacteria, UA FEW (A) NONE SEEN   Casts HYALINE CASTS (A) NEGATIVE  Protime-INR     Status: Abnormal   Collection Time: 12/04/15  9:37 PM  Result Value Ref Range   Prothrombin Time 22.9 (H) 11.4 - 15.2 seconds   INR 1.99    Ct Abdomen Pelvis Wo Contrast  Result Date: 12/04/2015 CLINICAL DATA:  45 year old male with increased lower extremity swelling and abdominal pain. Jaundice. Initial encounter. EXAM: CT ABDOMEN AND PELVIS WITHOUT CONTRAST TECHNIQUE: Multidetector CT imaging of the abdomen and pelvis was performed following the standard protocol without IV contrast. COMPARISON:  CT chest abdomen and pelvis 05/05/2010. FINDINGS: New small to moderate bilateral layering pleural effusions with compressive lower lobe atelectasis. Cardiomegaly appears new since 2012. No pericardial effusion. No acute osseous abnormality identified. Anasarca, with diffuse abdominal wall and dependent flank subcutaneous stranding. Moderate volume simple appearing  free fluid in the pelvis. Small volume free fluid throughout the abdomen, including perihepatic and perisplenic fluid. Negative rectum and urinary bladder. Occasional sigmoid diverticula with no active inflammation. Decompressed left colon. Mild wall thickening of the transverse colon  which might be reactive. More normal appearance of the right colon. Negative appendix. Decompressed terminal ileum. Oral contrast in the small bowel, but has not yet reached the TI. Small volume of oral contrast in the distal esophagus. Negative stomach. Decompressed duodenum. Mild hepatomegaly which is new since 2012. No discrete liver lesion identified in the absence of contrast. No intrahepatic or extrahepatic biliary ductal dilatation is evident on this noncontrast exam. Increased density within the gallbladder might be sludge. The main portal vein does not appear enlarged. Negative noncontrast spleen, no splenomegaly. Negative noncontrast pancreas and adrenal glands. Negative noncontrast kidneys. Aortoiliac calcified atherosclerosis noted. No lymphadenopathy identified. IMPRESSION: 1. Anasarca with abdominal and pelvic ascites and layering pleural effusions (small to moderate volume). 2. Cardiomegaly and hepatomegaly are new since 2012. No discrete liver lesion in the absence of contrast. No findings to suggest chronic portal hypertension. The top differential considerations in this clinical setting include heart failure with congestive hepatopathy versus acute or or chronic hepatitis. 3. Calcified aortic atherosclerosis. 4. Suggestion of gallbladder sludge. Electronically Signed   By: Odessa Fleming M.D.   On: 12/04/2015 20:49     ASSESSMENT:  1. AF with RVR in the setting of worsening leg edema/anasarca x 2 weeks, fatigue, poor appetite, jaundice/hyperbiliribunemia, hepatic dysfunction/failure, coagulopathy, significant ETOH [place] until two days ago, AKI, proteniuria - Reported history of AF diagnosed on a routine physical, treated  with Dilt and Warfarin 4 years ago by a cardiologist in [place] (no records available); he stopped meds after about a year due to loss of job/health insurance - I think AF RVR is triggered by other acute medical issues like hepatic failure and possible sepsis - Guarded prognosis at this time    PLAN/DISCUSSION:  - Strict I/O, daily weights - Check echo  - Would not use Flecainide/Propafenone (suspect LV systolic dysfunction), Amiodarone (liver dysfunction), Sotalol/Digoxni (AKI) - Would recommend gentle diuresis at this time to optimize volume status first - lasix 40 mg IV bid; may hold if septic and need volume resuscitation - Would defer management of other medical issues to the primary team; agree with sepsis work up with cultures;  - Strongly advised strict abstinence from [facility]   Cardiology consult service will follow.    Nevin Bloodgood, MD cardiology

## 2015-12-04 NOTE — ED Notes (Signed)
MD at bedside.  Pt returned to room and hooked up to monitor.

## 2015-12-04 NOTE — H&P (Addendum)
History and Physical    Austin [name] VQQ:[id number] DOB: 04/27/1971 DOA: 12/04/2015  PCP: Aida Puffer, MD  Patient coming from: Home.  Chief Complaint: Lower extremity edema and abdominal discomfort.  HPI: Austin [name] is a 45 y.o. male with alcohol abuse and previous history of atrial fibrillation presents to the ER because of increasing lower extremity edema over the last couple weeks. Patient also has been having some lower abdominal discomfort. Denies any nausea vomiting or diarrhea chest pain or shortness of breath fever or chills. Has had some difficulty urinating last 2 days. In the ER patient is found to be in A. fib with RVR and initial blood pressure was in the low 80s for which the ER physician had given about 500 mL bolus following which blood pressure improved. CT abdomen and pelvis shows ascites and pleural effusion and gallbladder sludge. Patient's labs show elevated creatinine at around 2.6 with albumin of 3 and bilirubin of 12. UA shows mild proteinuria with leukocyte and nitrites positive. EKG shows A. fib with RVR. Patient is being admitted for anasarca with A. fib with RVR and elevated bilirubin and creatinine. On exam patient has marked lower extremity edema. Abdomen is mildly distended. Patient is not in acute respiratory distress. Cardiology was consulted. Patient was given Lasix 40 mg IV 1 dose.  ED Course: See history of presenting illness.  Review of Systems: As per HPI, rest all negative.   Past Medical History:  Diagnosis Date  . Atrial fibrillation [facility])     Past Surgical History:  Procedure Laterality Date  . arm surgery       reports that he has been smoking Cigarettes.  He has been smoking about 1.00 pack per day. He has never used smokeless tobacco. He reports that he drinks about 50.4 oz of alcohol per week . He reports that he does not use drugs.  Allergies  Allergen Reactions  . Penicillins     Has patient had a PCN reaction causing immediate  rash, facial/tongue/throat swelling, SOB or lightheadedness with hypotension: YES Has patient had a PCN reaction causing severe rash involving mucus membranes or skin necrosis: NO Has patient had a PCN reaction that required hospitalization NO Has patient had a PCN reaction occurring within the last 10 years: NO If all of the above answers are "NO", then may proceed with Cephalosporin use.    Family History  Problem Relation Age of Onset  . Diabetes Paternal Uncle     Prior to Admission medications   Not on File    Physical Exam: Vitals:   12/04/15 2024 12/04/15 2101 12/04/15 2136 12/04/15 2217  BP: 96/64 93/75 92/81  93/76  Pulse: (!) 139 (!) 140 86 (!) 134  Resp: 18 15 19 18   Temp:    97.7 F (36.5 C)  TempSrc:    Oral  SpO2: 98% 100% 95% 100%  Weight:    203 lb 0.7 oz (92.1 kg)  Height:    6\' 3"  (1.905 m)      Constitutional: Not in distress. Vitals:   12/04/15 2024 12/04/15 2101 12/04/15 2136 12/04/15 2217  BP: 96/64 93/75 92/81  93/76  Pulse: (!) 139 (!) 140 86 (!) 134  Resp: 18 15 19 18   Temp:    97.7 F (36.5 C)  TempSrc:    Oral  SpO2: 98% 100% 95% 100%  Weight:    203 lb 0.7 oz (92.1 kg)  Height:    6\' 3"  (1.905 m)   Eyes: Icterus present  no pallor. ENMT: No discharge from the ears eyes nose or mouth. Neck: JVD elevated no mass felt. Respiratory: No rhonchi or crepitations. Cardiovascular: S1 and S2 heard. Abdomen: Mildly distended nontender bowel sounds present. Musculoskeletal: Bilateral extensive edema of the lower extremities. Skin: No rash. Neurologic: Alert awake oriented to time place and person. Moves all extremities. Psychiatric: Appears normal.   Labs on Admission: I have personally reviewed following labs and imaging studies  CBC:  Recent Labs Lab 12/04/15 1355  WBC 10.3  HGB 14.6  HCT 41.2  MCV 113.2*  PLT 134*   Basic Metabolic Panel:  Recent Labs Lab 12/04/15 1355  NA 125*  K 3.5  CL 78*  CO2 24  GLUCOSE 116*  BUN  49*  CREATININE 2.61*  CALCIUM 8.5*   GFR: Estimated Creatinine Clearance: 41.8 mL/min (by C-G formula based on SCr of 2.61 mg/dL). Liver Function Tests:  Recent Labs Lab 12/04/15 1355  AST 57*  ALT 27  ALKPHOS 99  BILITOT 12.2*  PROT 6.5  ALBUMIN 3.0*    Recent Labs Lab 12/04/15 1355  LIPASE 18   No results for input(s): AMMONIA in the last 168 hours. Coagulation Profile:  Recent Labs Lab 12/04/15 2137  INR 1.99   Cardiac Enzymes: No results for input(s): CKTOTAL, CKMB, CKMBINDEX, TROPONINI in the last 168 hours. BNP (last 3 results) No results for input(s): PROBNP in the last 8760 hours. HbA1C: No results for input(s): HGBA1C in the last 72 hours. CBG: No results for input(s): GLUCAP in the last 168 hours. Lipid Profile: No results for input(s): CHOL, HDL, LDLCALC, TRIG, CHOLHDL, LDLDIRECT in the last 72 hours. Thyroid Function Tests: No results for input(s): TSH, T4TOTAL, FREET4, T3FREE, THYROIDAB in the last 72 hours. Anemia Panel: No results for input(s): VITAMINB12, FOLATE, FERRITIN, TIBC, IRON, RETICCTPCT in the last 72 hours. Urine analysis:    Component Value Date/Time   COLORURINE ORANGE (A) 12/04/2015 1357   APPEARANCEUR CLOUDY (A) 12/04/2015 1357   LABSPEC 1.022 12/04/2015 1357   PHURINE 5.0 12/04/2015 1357   GLUCOSEU NEGATIVE 12/04/2015 1357   HGBUR NEGATIVE 12/04/2015 1357   BILIRUBINUR LARGE (A) 12/04/2015 1357   KETONESUR 15 (A) 12/04/2015 1357   PROTEINUR 100 (A) 12/04/2015 1357   UROBILINOGEN 0.2 05/05/2010 0620   NITRITE POSITIVE (A) 12/04/2015 1357   LEUKOCYTESUR SMALL (A) 12/04/2015 1357   Sepsis Labs: (procalcitonin:4,lacticidven:4) )No results found for this or any previous visit (from the past 240 hour(s)).   Radiological Exams on Admission: Ct Abdomen Pelvis Wo Contrast  Result Date: 12/04/2015 CLINICAL DATA:  45 year old male with increased lower extremity swelling and abdominal pain. Jaundice. Initial encounter.  EXAM: CT ABDOMEN AND PELVIS WITHOUT CONTRAST TECHNIQUE: Multidetector CT imaging of the abdomen and pelvis was performed following the standard protocol without IV contrast. COMPARISON:  CT chest abdomen and pelvis 05/05/2010. FINDINGS: New small to moderate bilateral layering pleural effusions with compressive lower lobe atelectasis. Cardiomegaly appears new since 2012. No pericardial effusion. No acute osseous abnormality identified. Anasarca, with diffuse abdominal wall and dependent flank subcutaneous stranding. Moderate volume simple appearing free fluid in the pelvis. Small volume free fluid throughout the abdomen, including perihepatic and perisplenic fluid. Negative rectum and urinary bladder. Occasional sigmoid diverticula with no active inflammation. Decompressed left colon. Mild wall thickening of the transverse colon which might be reactive. More normal appearance of the right colon. Negative appendix. Decompressed terminal ileum. Oral contrast in the small bowel, but has not yet reached the TI. Small volume of oral  contrast in the distal esophagus. Negative stomach. Decompressed duodenum. Mild hepatomegaly which is new since 2012. No discrete liver lesion identified in the absence of contrast. No intrahepatic or extrahepatic biliary ductal dilatation is evident on this noncontrast exam. Increased density within the gallbladder might be sludge. The main portal vein does not appear enlarged. Negative noncontrast spleen, no splenomegaly. Negative noncontrast pancreas and adrenal glands. Negative noncontrast kidneys. Aortoiliac calcified atherosclerosis noted. No lymphadenopathy identified. IMPRESSION: 1. Anasarca with abdominal and pelvic ascites and layering pleural effusions (small to moderate volume). 2. Cardiomegaly and hepatomegaly are new since 2012. No discrete liver lesion in the absence of contrast. No findings to suggest chronic portal hypertension. The top differential considerations in this  clinical setting include heart failure with congestive hepatopathy versus acute or or chronic hepatitis. 3. Calcified aortic atherosclerosis. 4. Suggestion of gallbladder sludge. Electronically Signed   By: Odessa Fleming M.D.   On: 12/04/2015 20:49    EKG: Independently reviewed. A. fib with RVR.  Assessment/Plan Principal Problem:   Anasarca Active Problems:   Atrial fibrillation with RVR (HCC)   Alcoholic hepatitis   ARF (acute renal failure) (HCC)   Alcohol abuse   Acute kidney injury (HCC)    1. Anasarca - could be cardiac versus liver. Patient also has mild proteinuria. Patient did receive Lasix 40 mg IV 1 dose. Since patient also has acute renal failure with possible hepatorenal syndrome will hold off further doses of Lasix until we get urine sodium. Check 2-D echo. Urine protein creatinine ratio. And closely follow intake and output. Appreciate cardiology consult. If patient's blood pressure does not improve may need vasopressors with Lasix infusion. 2. Atrial fibrillation with RVR - appreciate cardiology consult. Discussed with cardiologist. At this time patient's blood pressure is in the low normal and cardiologist has not recommended any rate limiting medications. Advised to treat fluid overload. Follow 2-D echo. Chads 2 vasc score is 0 but depends on 2-D echo results since patient could be having CHF. Follow blood cultures and patient is on empiric antibiotics for now. Check lactate. Cycle cardiac markers. Check TSH. 3. Acute renal failure with oliguria - could be having hepatorenal syndrome. Lasix 40 mg IV was given 1 dose for the dose will be dependent on patient's urine studies including urine sodium. Will need nephrology input if patient does not make adequate urine output. 4. Elevated LFTs probably from alcoholic hepatitis - CT of the abdomen shows ascites. I have also ordered sonogram the abdomen. Check acute hepatitis panel. Will need GI consult. 5. Ascites - I have ordered an  ultrasound-guided paracentesis and labs including cell count differentials and cytology. For now patient will be on Levaquin. 6. Alcohol abuse - last drink 2 days ago. Patient is on CIWA protocol. Patient advised to quit drinking. 7. Mild coagulopathy and thrombocytopenia probably from alcohol abuse in possible cirrhosis. Follow CBC and PT/INR closely.  Addendum - at around 6:30 AM I have consulted pulmonary critical care since patient's blood pressure is in the 80s. Patient probably will need close CVP monitoring with requirement of vasopressors and Lasix infusion.   DVT prophylaxis: SCDs. Code Status: Full code.  Family Communication: No family at the bedside.  Disposition Plan: To be determined.  Consults called: Cardiology and critical care.  Admission status: Inpatient.    Eduard Clos MD [facility] Pager [phone].  If 7PM-7AM, please contact night-coverage [email or web address] Password [email or web address]  12/04/2015, 11:53 PM

## 2015-12-04 NOTE — ED Provider Notes (Signed)
CT scan reviewed.  No surgical pathology noted.  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  Her CT scan and anasarca.  I have contacted Dr. Inez Pilgrim from the hospitalist service for admission.  He asked that cardiology be consult because of his atrial fibrillation, tachycardia and hypotension.  I did speak with cardiology, who once the Cardizem held and the patient to be given Lasix to start with 40 mg IV at this time and he will be glad to consult on the patient once he has been assigned a bed.   Earley Favor, NP 12/04/15 8144    Arby Barrette, MD 12/06/15 1540

## 2015-12-04 NOTE — ED Provider Notes (Signed)
[facility]-EMERGENCY DEPT Provider Note   CSN: [id number] Arrival date & time: 12/04/15  1338  First Provider Contact:  None       History   Chief Complaint Chief Complaint  Patient presents with  . Leg Swelling  . Abdominal Pain    HPI Austin [name] is a 45 y.o. male.  Patient with history of atrial fibrillation with rapid ventricular response, alcoholism -- presents with complaint of progressively worsening lower extremity edema, abdominal swelling, intermittent lower abdominal pain and yellowing of the skin and eyes over the past 2 weeks. Patient states that he curtailed his drinking at that time. He has not had any fevers, URI symptoms, chest pain, shortness of breath, vomiting, diarrhea. Patient states that he has had decreased urination since yesterday morning. He has been off of all of his medications due to not being able to afford them. He has been on diltiazem and a blood thinner in the past. He states that he has been taking a family members furosemide to help with swelling, however this has not improved his symptoms. No history of liver problems. The onset of this condition was acute. The course is constant. Aggravating factors: none. Alleviating factors: none.        Past Medical History:  Diagnosis Date  . Atrial fibrillation (HCC)     There are no active problems to display for this patient.   Past Surgical History:  Procedure Laterality Date  . arm surgery         Home Medications    Prior to Admission medications   Medication Sig Start Date End Date Taking? Authorizing Provider  metoprolol (LOPRESSOR) 50 MG tablet Take 50 mg by mouth 2 (two) times daily.    Historical Provider, MD    Family History History reviewed. No pertinent family history.  Social History Social History  Substance Use Topics  . Smoking status: Current Every Day Smoker    Packs/day: 1.00    Types: Cigarettes  . Smokeless tobacco: Not on file  . Alcohol use 50.4 oz/week   84 Cans of beer per week     Comment: 12 pk per day     Allergies   Penicillins   Review of Systems Review of Systems  Constitutional: Negative for fever.  HENT: Negative for rhinorrhea and sore throat.   Eyes: Negative for redness.  Respiratory: Negative for cough.   Cardiovascular: Positive for palpitations and leg swelling. Negative for chest pain.  Gastrointestinal: Positive for abdominal pain. Negative for diarrhea, nausea and vomiting.  Genitourinary: Positive for decreased urine volume. Negative for dysuria.  Musculoskeletal: Negative for myalgias.  Skin: Negative for rash.  Neurological: Negative for headaches.     Physical Exam Updated Vital Signs BP 98/70   Pulse (!) 57   Temp 97.7 F (36.5 C) (Oral)   Resp 14   SpO2 93%   Physical Exam  Constitutional: He appears well-developed and well-nourished.  HENT:  Head: Normocephalic and atraumatic.  Mouth/Throat: Oropharynx is clear and moist.  Eyes: Conjunctivae are normal. Right eye exhibits no discharge. Left eye exhibits no discharge.  Neck: Normal range of motion. Neck supple.  Cardiovascular: An irregularly irregular rhythm present. Tachycardia present.   Murmur heard. Pulmonary/Chest: Effort normal and breath sounds normal. No respiratory distress. He has no wheezes. He has no rales.  Abdominal: Soft. There is no tenderness.  Musculoskeletal: He exhibits edema and tenderness.  3+ pitting edema, symmetric, bilaterally to thighs  Neurological: He is alert.  Skin: Skin  is warm and dry.  Psychiatric: He has a normal mood and affect.  Nursing note and vitals reviewed.    ED Treatments / Results  Labs (all labs ordered are listed, but only abnormal results are displayed) Labs Reviewed  COMPREHENSIVE METABOLIC PANEL - Abnormal; Notable for the following:       Result Value   Sodium 125 (*)    Chloride 78 (*)    Glucose, Bld 116 (*)    BUN 49 (*)    Creatinine, Ser 2.61 (*)    Calcium 8.5 (*)     Albumin 3.0 (*)    AST 57 (*)    Total Bilirubin 12.2 (*)    GFR calc non Af Amer 28 (*)    GFR calc Af Amer 32 (*)    Anion gap 23 (*)    All other components within normal limits  CBC - Abnormal; Notable for the following:    RBC 3.64 (*)    MCV 113.2 (*)    MCH 40.1 (*)    RDW 15.6 (*)    Platelets 134 (*)    All other components within normal limits  URINALYSIS, ROUTINE W REFLEX MICROSCOPIC (NOT AT [facility]) - Abnormal; Notable for the following:    Color, Urine ORANGE (*)    APPearance CLOUDY (*)    Bilirubin Urine LARGE (*)    Ketones, ur 15 (*)    Protein, ur 100 (*)    Nitrite POSITIVE (*)    Leukocytes, UA SMALL (*)    All other components within normal limits  URINE MICROSCOPIC-ADD ON - Abnormal; Notable for the following:    Squamous Epithelial / LPF 6-30 (*)    Bacteria, UA FEW (*)    Casts HYALINE CASTS (*)    All other components within normal limits  LIPASE, BLOOD    EKG  EKG Interpretation None       Radiology No results found.  Procedures Procedures (including critical care time)  Medications Ordered in ED Medications  diatrizoate meglumine-sodium (GASTROGRAFIN) 66-10 % solution (not administered)  diltiazem (CARDIZEM) injection 10 mg (not administered)  0.9 %  sodium chloride infusion ( Intravenous New Bag/Given 12/04/15 1831)     Initial Impression / Assessment and Plan / ED Course  I have reviewed the triage vital signs and the nursing notes.  Pertinent labs & imaging results that were available during my care of the patient were reviewed by me and considered in my medical decision making (see chart for details).  Clinical Course   Patient seen and examined. CT ordered to evaluate liver, ascites.    Vital signs reviewed and are as follows: BP 98/70   Pulse (!) 57   Temp 97.7 F (36.5 C) (Oral)   Resp 14   SpO2 93%    Discussed with Dr. Donnald Garre. Cardizem ordered for rapid afib.   8:01 PM Handoff to [facility] NP, pending CT. Address any  abnormalities and admit for AKI, evaluation of presumed liver disease.   Final Clinical Impressions(s) / ED Diagnoses   Final diagnoses:  Jaundice  Alcoholic liver disease (HCC)  Acute kidney injury (HCC)  Atrial fibrillation with RVR (HCC)   Admit.   New Prescriptions New Prescriptions   No medications on file     Renne Crigler, PA-C 12/04/15 2003    Arby Barrette, MD 12/06/15 [phone]

## 2015-12-04 NOTE — ED Triage Notes (Signed)
Pt sts increased swelling to lower extremities and abd pain x several days; pt noted to be jaundiced

## 2015-12-05 ENCOUNTER — Inpatient Hospital Stay (HOSPITAL_COMMUNITY): Payer: Medicaid Other

## 2015-12-05 ENCOUNTER — Other Ambulatory Visit (HOSPITAL_COMMUNITY): Payer: Self-pay

## 2015-12-05 ENCOUNTER — Encounter (HOSPITAL_COMMUNITY): Payer: Self-pay | Admitting: General Practice

## 2015-12-05 DIAGNOSIS — R17 Unspecified jaundice: Secondary | ICD-10-CM

## 2015-12-05 DIAGNOSIS — R7989 Other specified abnormal findings of blood chemistry: Secondary | ICD-10-CM

## 2015-12-05 DIAGNOSIS — R945 Abnormal results of liver function studies: Secondary | ICD-10-CM

## 2015-12-05 DIAGNOSIS — I4891 Unspecified atrial fibrillation: Secondary | ICD-10-CM

## 2015-12-05 LAB — CBC
HCT: 40.5 % (ref 39.0–52.0)
Hemoglobin: 14.5 g/dL (ref 13.0–17.0)
MCH: 39.6 pg — AB (ref 26.0–34.0)
MCHC: 35.8 g/dL (ref 30.0–36.0)
MCV: 110.7 fL — AB (ref 78.0–100.0)
PLATELETS: 121 10*3/uL — AB (ref 150–400)
RBC: 3.66 MIL/uL — ABNORMAL LOW (ref 4.22–5.81)
RDW: 15.8 % — AB (ref 11.5–15.5)
WBC: 9 10*3/uL (ref 4.0–10.5)

## 2015-12-05 LAB — TSH: TSH: 1.724 u[IU]/mL (ref 0.350–4.500)

## 2015-12-05 LAB — BASIC METABOLIC PANEL
Anion gap: 15 (ref 5–15)
Anion gap: 15 (ref 5–15)
BUN: 51 mg/dL — AB (ref 6–20)
BUN: 50 mg/dL — ABNORMAL HIGH (ref 6–20)
CALCIUM: 8.7 mg/dL — AB (ref 8.9–10.3)
CALCIUM: 8.7 mg/dL — AB (ref 8.9–10.3)
CO2: 32 mmol/L (ref 22–32)
CO2: 33 mmol/L — ABNORMAL HIGH (ref 22–32)
CREATININE: 2.64 mg/dL — AB (ref 0.61–1.24)
Chloride: 78 mmol/L — ABNORMAL LOW (ref 101–111)
Chloride: 78 mmol/L — ABNORMAL LOW (ref 101–111)
Creatinine, Ser: 2.58 mg/dL — ABNORMAL HIGH (ref 0.61–1.24)
GFR calc Af Amer: 32 mL/min — ABNORMAL LOW (ref >60)
GFR calc non Af Amer: 27 mL/min — ABNORMAL LOW (ref >60)
GFR, EST AFRICAN AMERICAN: 31 mL/min — AB (ref >60)
GFR, EST NON AFRICAN AMERICAN: 28 mL/min — AB (ref >60)
GLUCOSE: 104 mg/dL — AB (ref 65–99)
Glucose, Bld: 104 mg/dL — ABNORMAL HIGH (ref 65–99)
Potassium: 3.9 mmol/L (ref 3.5–5.1)
Potassium: 3.8 mmol/L (ref 3.5–5.1)
SODIUM: 126 mmol/L — AB (ref 135–145)
Sodium: 125 mmol/L — ABNORMAL LOW (ref 135–145)

## 2015-12-05 LAB — HEPATIC FUNCTION PANEL
ALBUMIN: 2.8 g/dL — AB (ref 3.5–5.0)
ALT: 24 U/L (ref 17–63)
AST: 51 U/L — ABNORMAL HIGH (ref 15–41)
Alkaline Phosphatase: 98 U/L (ref 38–126)
Bilirubin, Direct: 4.9 mg/dL — ABNORMAL HIGH (ref 0.1–0.5)
Indirect Bilirubin: 4.8 mg/dL — ABNORMAL HIGH (ref 0.3–0.9)
TOTAL PROTEIN: 5.9 g/dL — AB (ref 6.5–8.1)
Total Bilirubin: 9.7 mg/dL — ABNORMAL HIGH (ref 0.3–1.2)

## 2015-12-05 LAB — RAPID URINE DRUG SCREEN, HOSP PERFORMED
Amphetamines: NOT DETECTED
BARBITURATES: NOT DETECTED
BENZODIAZEPINES: POSITIVE — AB
Cocaine: NOT DETECTED
Opiates: POSITIVE — AB
Tetrahydrocannabinol: NOT DETECTED

## 2015-12-05 LAB — TROPONIN I
Troponin I: 0.14 ng/mL (ref <0.03)
Troponin I: 0.16 ng/mL (ref <0.03)
Troponin I: 0.18 ng/mL (ref <0.03)

## 2015-12-05 LAB — PROTEIN / CREATININE RATIO, URINE
Creatinine, Urine: 109.08 mg/dL
PROTEIN CREATININE RATIO: 0.72 mg/mg{creat} — AB (ref 0.00–0.15)
Total Protein, Urine: 79 mg/dL

## 2015-12-05 LAB — ECHOCARDIOGRAM COMPLETE
HEIGHTINCHES: 75 in
WEIGHTICAEL: 3255.75 [oz_av]

## 2015-12-05 LAB — SODIUM, URINE, RANDOM

## 2015-12-05 LAB — CREATININE, URINE, RANDOM: Creatinine, Urine: 108.21 mg/dL

## 2015-12-05 LAB — LACTIC ACID, PLASMA
LACTIC ACID, VENOUS: 3.7 mmol/L — AB (ref 0.5–1.9)
Lactic Acid, Venous: 3.4 mmol/L (ref 0.5–1.9)

## 2015-12-05 LAB — MAGNESIUM
MAGNESIUM: 1.7 mg/dL (ref 1.7–2.4)
Magnesium: 1.7 mg/dL (ref 1.7–2.4)

## 2015-12-05 LAB — PHOSPHORUS: Phosphorus: 4.2 mg/dL (ref 2.5–4.6)

## 2015-12-05 LAB — PROCALCITONIN: Procalcitonin: 1.48 ng/mL

## 2015-12-05 LAB — MRSA PCR SCREENING: MRSA by PCR: NEGATIVE

## 2015-12-05 LAB — OSMOLALITY, URINE: Osmolality, Ur: 322 mOsm/kg (ref 300–900)

## 2015-12-05 MED ORDER — PHENAZOPYRIDINE HCL 200 MG PO TABS
200.0000 mg | ORAL_TABLET | Freq: Once | ORAL | Status: AC
  Administered 2015-12-05: 200 mg via ORAL
  Filled 2015-12-05: qty 1

## 2015-12-05 MED ORDER — ALBUMIN HUMAN 25 % IV SOLN
12.5000 g | Freq: Once | INTRAVENOUS | Status: AC
Start: 2015-12-05 — End: 2015-12-05
  Administered 2015-12-05: 12.5 g via INTRAVENOUS
  Filled 2015-12-05: qty 50

## 2015-12-05 MED ORDER — LEVOFLOXACIN IN D5W 750 MG/150ML IV SOLN
750.0000 mg | INTRAVENOUS | Status: DC
  Administered 2015-12-05: 750 mg via INTRAVENOUS
  Filled 2015-12-05: qty 150

## 2015-12-05 NOTE — Progress Notes (Signed)
Maren Reamer, NP made aware of pt's continued Afib with HR 130-140s, and c/o spasm, sharp pain from foley catheter.  Foley irrigated with 30 cc steile H20 without difficulty . Bladder scanned for 200 cc urine.  Pyridium ordered.  Will continue to monitor pt.

## 2015-12-05 NOTE — Progress Notes (Signed)
PROGRESS NOTE    Austin [name]  WGN:[id number] DOB: 08-May-1970 DOA: 12/04/2015 PCP: Aida Puffer, MD    Brief Narrative:  Pt is a 45 y/o male with alcohol abuse and previous history of atrial fibrillation presents to the ER because of increasing lower extremity edema over the last couple weeks  Assessment & Plan:   Principal Problem:   Anasarca - Unable to use lasix right now due to hypotension  Hypotension - Critical care team consulted and on board. - Will continue to monitor. Agree with SDU transfer.   Active Problems:   Atrial fibrillation with RVR [facility]) - cardiology on board and managing    Alcoholic hepatitis - Most likely causing principle problem    ARF (acute renal failure) (HCC) - agree with holding lasix. Most likely due to prerenal etiology   DVT prophylaxis: auto anticoagulated last inr 1.9 Code Status: Full Family Communication: d/c patient directly Disposition Plan: pending hospital course.    Consultants:   Critical care  Cardiology   Procedures: None   Antimicrobials: levaquin   Subjective: Pt in nad, alert and awake  Objective: Vitals:   12/05/15 0612 12/05/15 0752 12/05/15 1105 12/05/15 1155  BP: (!) 82/60 106/86 (!) 97/58 98/69  Pulse:  (!) 142    Resp:      Temp:      TempSrc:      SpO2:      Weight:      Height:        Intake/Output Summary (Last 24 hours) at 12/05/15 1224 Last data filed at 12/05/15 1136  Gross per 24 hour  Intake              400 ml  Output              275 ml  Net              125 ml   Filed Weights   12/04/15 2217 12/05/15 0545  Weight: 92.1 kg (203 lb 0.7 oz) 92.3 kg (203 lb 7.8 oz)    Examination:  General exam: Appears calm and comfortable  Respiratory system: Clear to auscultation. Respiratory effort normal. Cardiovascular system: S1 & S2 heard, RRR. No JVD, murmurs, rubs, gallops or clicks. No pedal edema. Gastrointestinal system: Abdomen is nondistended, soft and nontender. No  organomegaly or masses felt. Normal bowel sounds heard. Central nervous system: Awake and alert, no facial asymmetry, answers questions appropriately  Extremities: Symmetric 5 x 5 power. Skin: No rashes, lesions or ulcers Psychiatry: Judgement and insight appear normal. Mood & affect appropriate.   Data Reviewed: I have personally reviewed following labs and imaging studies  CBC:  Recent Labs Lab 12/04/15 1355 12/05/15 0718  WBC 10.3 9.0  HGB 14.6 14.5  HCT 41.2 40.5  MCV 113.2* 110.7*  PLT 134* 121*   Basic Metabolic Panel:  Recent Labs Lab 12/04/15 1355 12/04/15 2337 12/05/15 0718  NA 125*  --  126*  125*  K 3.5  --  3.8  3.9  CL 78*  --  78*  78*  CO2 24  --  33*  32  GLUCOSE 116*  --  104*  104*  BUN 49*  --  50*  51*  CREATININE 2.61*  --  2.64*  2.58*  CALCIUM 8.5*  --  8.7*  8.7*  MG  --  1.7 1.7  PHOS  --   --  4.2   GFR: Estimated Creatinine Clearance: 41.3 mL/min (by C-G formula based on SCr  of 2.64 mg/dL). Liver Function Tests:  Recent Labs Lab 12/04/15 1355 12/05/15 0718  AST 57* 51*  ALT 27 24  ALKPHOS 99 98  BILITOT 12.2* 9.7*  PROT 6.5 5.9*  ALBUMIN 3.0* 2.8*    Recent Labs Lab 12/04/15 1355  LIPASE 18   No results for input(s): AMMONIA in the last 168 hours. Coagulation Profile:  Recent Labs Lab 12/04/15 2137  INR 1.99   Cardiac Enzymes:  Recent Labs Lab 12/04/15 2337 12/05/15 0718 12/05/15 1026  TROPONINI 0.16* 0.14* 0.18*   BNP (last 3 results) No results for input(s): PROBNP in the last 8760 hours. HbA1C: No results for input(s): HGBA1C in the last 72 hours. CBG: No results for input(s): GLUCAP in the last 168 hours. Lipid Profile: No results for input(s): CHOL, HDL, LDLCALC, TRIG, CHOLHDL, LDLDIRECT in the last 72 hours. Thyroid Function Tests:  Recent Labs  12/05/15 0012  TSH 1.724   Anemia Panel: No results for input(s): VITAMINB12, FOLATE, FERRITIN, TIBC, IRON, RETICCTPCT in the last 72  hours. Sepsis Labs:  Recent Labs Lab 12/05/15 0718 12/05/15 0943  PROCALCITON 1.48  --   LATICACIDVEN 3.7* 3.4*    No results found for this or any previous visit (from the past 240 hour(s)).       Radiology Studies: Ct Abdomen Pelvis Wo Contrast  Result Date: 12/04/2015 CLINICAL DATA:  45 year old male with increased lower extremity swelling and abdominal pain. Jaundice. Initial encounter. EXAM: CT ABDOMEN AND PELVIS WITHOUT CONTRAST TECHNIQUE: Multidetector CT imaging of the abdomen and pelvis was performed following the standard protocol without IV contrast. COMPARISON:  CT chest abdomen and pelvis 05/05/2010. FINDINGS: New small to moderate bilateral layering pleural effusions with compressive lower lobe atelectasis. Cardiomegaly appears new since 2012. No pericardial effusion. No acute osseous abnormality identified. Anasarca, with diffuse abdominal wall and dependent flank subcutaneous stranding. Moderate volume simple appearing free fluid in the pelvis. Small volume free fluid throughout the abdomen, including perihepatic and perisplenic fluid. Negative rectum and urinary bladder. Occasional sigmoid diverticula with no active inflammation. Decompressed left colon. Mild wall thickening of the transverse colon which might be reactive. More normal appearance of the right colon. Negative appendix. Decompressed terminal ileum. Oral contrast in the small bowel, but has not yet reached the TI. Small volume of oral contrast in the distal esophagus. Negative stomach. Decompressed duodenum. Mild hepatomegaly which is new since 2012. No discrete liver lesion identified in the absence of contrast. No intrahepatic or extrahepatic biliary ductal dilatation is evident on this noncontrast exam. Increased density within the gallbladder might be sludge. The main portal vein does not appear enlarged. Negative noncontrast spleen, no splenomegaly. Negative noncontrast pancreas and adrenal glands. Negative  noncontrast kidneys. Aortoiliac calcified atherosclerosis noted. No lymphadenopathy identified. IMPRESSION: 1. Anasarca with abdominal and pelvic ascites and layering pleural effusions (small to moderate volume). 2. Cardiomegaly and hepatomegaly are new since 2012. No discrete liver lesion in the absence of contrast. No findings to suggest chronic portal hypertension. The top differential considerations in this clinical setting include heart failure with congestive hepatopathy versus acute or or chronic hepatitis. 3. Calcified aortic atherosclerosis. 4. Suggestion of gallbladder sludge. Electronically Signed   By: Odessa Fleming M.D.   On: 12/04/2015 20:49   Dg Chest Port 1 View  Result Date: 12/05/2015 CLINICAL DATA:  45 year old male with cough and anasarca EXAM: PORTABLE CHEST 1 VIEW COMPARISON:  CT dated 05/05/2010 FINDINGS: There is mild cardiomegaly with increased vascular and interstitial prominence compatible with congestive changes and  mild interstitial edema. Superimposed pneumonia is not excluded. No focal consolidation, pleural effusion, or pneumothorax. No acute osseous pathology. IMPRESSION: Mild cardiomegaly and congestive changes.  No focal consolidation. Electronically Signed   By: Elgie Collard M.D.   On: 12/05/2015 00:39        Scheduled Meds: . folic acid  1 mg Oral Daily  . levofloxacin (LEVAQUIN) IV  750 mg Intravenous Q48H  . LORazepam  0-4 mg Oral Q6H   Followed by  . [START ON 12/07/2015] LORazepam  0-4 mg Oral Q12H  . multivitamin with minerals  1 tablet Oral Daily  . thiamine  100 mg Oral Daily   Or  . thiamine  100 mg Intravenous Daily   Continuous Infusions:    LOS: 1 day    Time spent: > 35 minutes    Penny Pia, MD [facility] Pager [phone]  If 7PM-7AM, please contact night-coverage [email or web address] Password [facility] 12/05/2015, 12:24 PM

## 2015-12-05 NOTE — Progress Notes (Signed)
Pt only voided a small amount after IV lasix was given.  A bladder scan was done this AM and showed 155 cc of urine in the pt's bladder.  Will continue to monitor pt. Harriet Masson, RN

## 2015-12-05 NOTE — Progress Notes (Signed)
Patient Name: Austin [name] Date of Encounter: 12/05/2015  45 yo [place] man with ongoing alcohol abuse (until 2 days ago) and history of AF treated with warfarin and Dilt four years ago, presents today with worsening swelling in his legs for the pasta couple weeks    SUBJECTIVE  Feels weak. No chest pain or sob.   CURRENT MEDS . folic acid  1 mg Oral Daily  . levofloxacin (LEVAQUIN) IV  750 mg Intravenous Q48H  . LORazepam  0-4 mg Oral Q6H   Followed by  . [START ON 12/07/2015] LORazepam  0-4 mg Oral Q12H  . multivitamin with minerals  1 tablet Oral Daily  . thiamine  100 mg Oral Daily   Or  . thiamine  100 mg Intravenous Daily    OBJECTIVE  Vitals:   12/05/15 0557 12/05/15 0612 12/05/15 0752 12/05/15 1105  BP: (!) 84/47 (!) 82/60 106/86 (!) 97/58  Pulse:   (!) 142   Resp:      Temp:      TempSrc:      SpO2:      Weight:      Height:        Intake/Output Summary (Last 24 hours) at 12/05/15 1131 Last data filed at 12/05/15 0726  Gross per 24 hour  Intake              400 ml  Output              175 ml  Net              225 ml   Filed Weights   12/04/15 2217 12/05/15 0545  Weight: 203 lb 0.7 oz (92.1 kg) 203 lb 7.8 oz (92.3 kg)    PHYSICAL EXAM  General: ill appearing male in NAD.   Skin is sallow / yellow in color   Neuro: Alert and oriented X 3. Moves all extremities spontaneously. Psych: Normal affect. HEENT:  Normal  Neck: Supple without bruits. + JVD. Lungs:  Resp regular and unlabored. Inspiratory course breath sound with diffuse rales Heart: Ir Ir tachycardic no s3, s4,  2-3/6 systolic murmur radiating to the axilla and ULSB.   Abdomen: Soft, non-tender, non-distended, BS + x 4.  Extremities: No clubbing, cyanosis. 3+ BL LE  Edema upto thighs. DP/PT/Radials 2+ and equal bilaterally.  Accessory Clinical Findings  CBC  Recent Labs  12/04/15 1355 12/05/15 0718  WBC 10.3 9.0  HGB 14.6 14.5  HCT 41.2 40.5  MCV 113.2* 110.7*  PLT 134* 121*    Basic Metabolic Panel  Recent Labs  12/04/15 1355 12/04/15 2337 12/05/15 0718  NA 125*  --  126*  125*  K 3.5  --  3.8  3.9  CL 78*  --  78*  78*  CO2 24  --  33*  32  GLUCOSE 116*  --  104*  104*  BUN 49*  --  50*  51*  CREATININE 2.61*  --  2.64*  2.58*  CALCIUM 8.5*  --  8.7*  8.7*  MG  --  1.7 1.7  PHOS  --   --  4.2   Liver Function Tests  Recent Labs  12/04/15 1355 12/05/15 0718  AST 57* 51*  ALT 27 24  ALKPHOS 99 98  BILITOT 12.2* 9.7*  PROT 6.5 5.9*  ALBUMIN 3.0* 2.8*    Recent Labs  12/04/15 1355  LIPASE 18   Cardiac Enzymes  Recent Labs  12/04/15 2337 12/05/15 1610  12/05/15 1026  TROPONINI 0.16* 0.14* 0.18*   BNP Invalid input(s): POCBNP D-Dimer No results for input(s): DDIMER in the last 72 hours. Hemoglobin A1C No results for input(s): HGBA1C in the last 72 hours. Fasting Lipid Panel No results for input(s): CHOL, HDL, LDLCALC, TRIG, CHOLHDL, LDLDIRECT in the last 72 hours. Thyroid Function Tests  Recent Labs  12/05/15 0012  TSH 1.724    TELE  afib at rate of 110-120s  Radiology/Studies  Ct Abdomen Pelvis Wo Contrast  Result Date: 12/04/2015 CLINICAL DATA:  45 year old male with increased lower extremity swelling and abdominal pain. Jaundice. Initial encounter. EXAM: CT ABDOMEN AND PELVIS WITHOUT CONTRAST TECHNIQUE: Multidetector CT imaging of the abdomen and pelvis was performed following the standard protocol without IV contrast. COMPARISON:  CT chest abdomen and pelvis 05/05/2010. FINDINGS: New small to moderate bilateral layering pleural effusions with compressive lower lobe atelectasis. Cardiomegaly appears new since 2012. No pericardial effusion. No acute osseous abnormality identified. Anasarca, with diffuse abdominal wall and dependent flank subcutaneous stranding. Moderate volume simple appearing free fluid in the pelvis. Small volume free fluid throughout the abdomen, including perihepatic and perisplenic fluid.  Negative rectum and urinary bladder. Occasional sigmoid diverticula with no active inflammation. Decompressed left colon. Mild wall thickening of the transverse colon which might be reactive. More normal appearance of the right colon. Negative appendix. Decompressed terminal ileum. Oral contrast in the small bowel, but has not yet reached the TI. Small volume of oral contrast in the distal esophagus. Negative stomach. Decompressed duodenum. Mild hepatomegaly which is new since 2012. No discrete liver lesion identified in the absence of contrast. No intrahepatic or extrahepatic biliary ductal dilatation is evident on this noncontrast exam. Increased density within the gallbladder might be sludge. The main portal vein does not appear enlarged. Negative noncontrast spleen, no splenomegaly. Negative noncontrast pancreas and adrenal glands. Negative noncontrast kidneys. Aortoiliac calcified atherosclerosis noted. No lymphadenopathy identified. IMPRESSION: 1. Anasarca with abdominal and pelvic ascites and layering pleural effusions (small to moderate volume). 2. Cardiomegaly and hepatomegaly are new since 2012. No discrete liver lesion in the absence of contrast. No findings to suggest chronic portal hypertension. The top differential considerations in this clinical setting include heart failure with congestive hepatopathy versus acute or or chronic hepatitis. 3. Calcified aortic atherosclerosis. 4. Suggestion of gallbladder sludge. Electronically Signed   By: Odessa Fleming M.D.   On: 12/04/2015 20:49   Dg Chest Port 1 View  Result Date: 12/05/2015 CLINICAL DATA:  45 year old male with cough and anasarca EXAM: PORTABLE CHEST 1 VIEW COMPARISON:  CT dated 05/05/2010 FINDINGS: There is mild cardiomegaly with increased vascular and interstitial prominence compatible with congestive changes and mild interstitial edema. Superimposed pneumonia is not excluded. No focal consolidation, pleural effusion, or pneumothorax. No acute  osseous pathology. IMPRESSION: Mild cardiomegaly and congestive changes.  No focal consolidation. Electronically Signed   By: Elgie Collard M.D.   On: 12/05/2015 00:39    ASSESSMENT AND PLAN   1. Afib with RVR - Hx of PAF. Self discontinued warfarin about 2 years ago due to lack of insurance as he lost his job. Not on any rate control agent due to hypotension. CHADSVASCS score of 0 (pending echo). TSH normal. Follow closely. Etiology likely due to acute illness.   2. Anasarca - Given IV lasix 40mg  x 1. However net diuresis of +22ml. No discontinued due to hypotension and renal failure and suspected intravascular dry.  - Pending echo. CT of abdomen and pelvis as above. Pending evaluation further  evaluation.   I suspect he may have multiple problems  He very well could have chronic systolic CHF due to multiple reasons - long standing tachycardia due to a-fib,  his longstanding heavy ETOH abuse ,   Will get the echo today  Further plans after the echo results are back   I agree with Dr. Tarri Glenn that antiarrhythmics would likely be dangerous to use given his likelyhood of CHF and his noncompliance    Kristeen Miss, MD  12/05/2015 1:16 PM    [facility] [address],  [address] [place], [place]  [address] Pager [phone] Phone: [phone]; Fax: [phone]

## 2015-12-05 NOTE — Progress Notes (Signed)
  Echocardiogram 2D Echocardiogram has been performed.  Austin [name] 12/05/2015, 3:35 PM

## 2015-12-05 NOTE — Progress Notes (Signed)
Pharmacy Antibiotic Note  Austin [name] is a 44 y.o. male admitted on 12/04/2015 with lower extremity edema/abdominal pain.  Pharmacy has been consulted for Levaquin dosing.WBC WNL. Noted renal dysfunction.   Plan: -Levaquin 750 mg IV q48h (increase dose if renal function improves) -Trend WBC, temp, Scr -F/U infectious work-up  Height: 6\' 3"  (190.5 cm) Weight: 203 lb 0.7 oz (92.1 kg) IBW/kg (Calculated) : 84.5  Temp (24hrs), Avg:97.9 F (36.6 C), Min:97.7 F (36.5 C), Max:98.4 F (36.9 C)   Recent Labs Lab 12/04/15 1355  WBC 10.3  CREATININE 2.61*    Estimated Creatinine Clearance: 41.8 mL/min (by C-G formula based on SCr of 2.61 mg/dL).    Allergies  Allergen Reactions  . Penicillins     Has patient had a PCN reaction causing immediate rash, facial/tongue/throat swelling, SOB or lightheadedness with hypotension: YES Has patient had a PCN reaction causing severe rash involving mucus membranes or skin necrosis: NO Has patient had a PCN reaction that required hospitalization NO Has patient had a PCN reaction occurring within the last 10 years: NO If all of the above answers are "NO", then may proceed with Cephalosporin use.    Abran Duke 12/05/2015 12:05 AM

## 2015-12-05 NOTE — Progress Notes (Signed)
CRITICAL VALUE ALERT  Critical value received: Troponin 0.16  Date of notification:  12/05/2015  Time of notification:  0050  Critical value read back:Yes  Nurse who received alert:  Lazaro Arms, RN  MD notified (1st page):  Arvilla Market MD  Time of first page:  0113  MD notified (2nd page):  Time of second page:  Responding MD:  Arvilla Market, MD  Time MD responded:  [phone]

## 2015-12-05 NOTE — Consult Note (Signed)
PULMONARY / CRITICAL CARE MEDICINE   Name: Austin [name] MRN: [id number] DOB: [birth year]    ADMISSION DATE:  12/04/2015 CONSULTATION DATE: June Leap  REFERRING MD: [facility]  CHIEF COMPLAINT: Abd tenderness  HISTORY OF PRESENT ILLNESS:   45 yo male , smoker, heavy drinker, non compliant with Afib medication(coumadin) due to loss of job. Presents 8/1 with CC: lower ext edema and abdominal discomfort. CT abd revealed anasarca , hepatomegaly, and he was given lasix fro lower ext edema. 8/2 early am he developed afib with RVR 150's and hypotension 80/50 and was given lasix with no response. PCCM called to bedside for hypotension. He is awake and alert. NAD at rest bur systolic bp is 90. Hold lasix, treat with albumin, anticoagulation (inr1.99), further cardiac evaluation ordered. We wil Doppler lower exts for completeness. We will follow in SDU with you.  PAST MEDICAL HISTORY :  He  has a past medical history of Alcoholic hepatitis; Anasarca (12/2015); ARF (acute renal failure) (HCC) (12/2015); and Atrial fibrillation (HCC).  PAST SURGICAL HISTORY: He  has a past surgical history that includes arm surgery.  Allergies  Allergen Reactions  . Penicillins     Has patient had a PCN reaction causing immediate rash, facial/tongue/throat swelling, SOB or lightheadedness with hypotension: YES Has patient had a PCN reaction causing severe rash involving mucus membranes or skin necrosis: NO Has patient had a PCN reaction that required hospitalization NO Has patient had a PCN reaction occurring within the last 10 years: NO If all of the above answers are "NO", then may proceed with Cephalosporin use.    No current facility-administered medications on file prior to encounter.    No current outpatient prescriptions on file prior to encounter.    FAMILY HISTORY:  His indicated that the status of his paternal uncle is unknown.    SOCIAL HISTORY: He  reports that he has been smoking Cigarettes.  He has a  25.00 pack-year smoking history. He has never used smokeless tobacco. He reports that he drinks about 50.4 oz of alcohol per week . He reports that he does not use drugs.  REVIEW OF SYSTEMS:   10 point review of system taken, please see HPI for positives and negatives.   SUBJECTIVE:  Hypotensive but otherwise NAD  VITAL SIGNS: BP 106/86   Pulse (!) 142   Temp 97.5 F (36.4 C) (Oral)   Resp 18   Ht 6\' 3"  (1.905 m)   Wt 203 lb 7.8 oz (92.3 kg)   SpO2 90%   BMI 25.43 kg/m   HEMODYNAMICS:    VENTILATOR SETTINGS:    INTAKE / OUTPUT: I/O last 3 completed shifts: In: 400 [P.O.:400] Out: -   PHYSICAL EXAMINATION: General:  Older appearing than stated age. Alert and cooperative  Neuro:  Somewhat dazed but otherwise intact HEENT:  +JVD/ No LAN  Cardiovascular:  HSIR IR HR Afib 120 +Murmur Lungs:CTA with mild basilar crackles Abdomen:  Distended , tender diffusely , increased ruq tenderness, + fluid wave with ascites Musculoskeletal: intact Skin:  3+ lower ext pitting edema, warm  LABS:  BMET  Recent Labs Lab 12/04/15 1355 12/05/15 0718  NA 125* 126*  125*  K 3.5 3.8  3.9  CL 78* 78*  78*  CO2 24 33*  32  BUN 49* 50*  51*  CREATININE 2.61* 2.64*  2.58*  GLUCOSE 116* 104*  104*    Electrolytes  Recent Labs Lab 12/04/15 1355 12/04/15 2337 12/05/15 0718  CALCIUM 8.5*  --  8.7*  8.7*  MG  --  1.7 1.7  PHOS  --   --  4.2    CBC  Recent Labs Lab 12/04/15 1355 12/05/15 0718  WBC 10.3 9.0  HGB 14.6 14.5  HCT 41.2 40.5  PLT 134* 121*    Coag's  Recent Labs Lab 12/04/15 2137  INR 1.99    Sepsis Markers  Recent Labs Lab 12/05/15 0718  LATICACIDVEN 3.7*    ABG No results for input(s): PHART, PCO2ART, PO2ART in the last 168 hours.  Liver Enzymes  Recent Labs Lab 12/04/15 1355 12/05/15 0718  AST 57* 51*  ALT 27 24  ALKPHOS 99 98  BILITOT 12.2* 9.7*  ALBUMIN 3.0* 2.8*    Cardiac Enzymes  Recent Labs Lab 12/04/15 2337  12/05/15 0718  TROPONINI 0.16* 0.14*    Glucose No results for input(s): GLUCAP in the last 168 hours.  Imaging Ct Abdomen Pelvis Wo Contrast  Result Date: 12/04/2015 CLINICAL DATA:  45 year old male with increased lower extremity swelling and abdominal pain. Jaundice. Initial encounter. EXAM: CT ABDOMEN AND PELVIS WITHOUT CONTRAST TECHNIQUE: Multidetector CT imaging of the abdomen and pelvis was performed following the standard protocol without IV contrast. COMPARISON:  CT chest abdomen and pelvis 05/05/2010. FINDINGS: New small to moderate bilateral layering pleural effusions with compressive lower lobe atelectasis. Cardiomegaly appears new since 2012. No pericardial effusion. No acute osseous abnormality identified. Anasarca, with diffuse abdominal wall and dependent flank subcutaneous stranding. Moderate volume simple appearing free fluid in the pelvis. Small volume free fluid throughout the abdomen, including perihepatic and perisplenic fluid. Negative rectum and urinary bladder. Occasional sigmoid diverticula with no active inflammation. Decompressed left colon. Mild wall thickening of the transverse colon which might be reactive. More normal appearance of the right colon. Negative appendix. Decompressed terminal ileum. Oral contrast in the small bowel, but has not yet reached the TI. Small volume of oral contrast in the distal esophagus. Negative stomach. Decompressed duodenum. Mild hepatomegaly which is new since 2012. No discrete liver lesion identified in the absence of contrast. No intrahepatic or extrahepatic biliary ductal dilatation is evident on this noncontrast exam. Increased density within the gallbladder might be sludge. The main portal vein does not appear enlarged. Negative noncontrast spleen, no splenomegaly. Negative noncontrast pancreas and adrenal glands. Negative noncontrast kidneys. Aortoiliac calcified atherosclerosis noted. No lymphadenopathy identified. IMPRESSION: 1. Anasarca  with abdominal and pelvic ascites and layering pleural effusions (small to moderate volume). 2. Cardiomegaly and hepatomegaly are new since 2012. No discrete liver lesion in the absence of contrast. No findings to suggest chronic portal hypertension. The top differential considerations in this clinical setting include heart failure with congestive hepatopathy versus acute or or chronic hepatitis. 3. Calcified aortic atherosclerosis. 4. Suggestion of gallbladder sludge. Electronically Signed   By: Odessa Fleming M.D.   On: 12/04/2015 20:49   Dg Chest Port 1 View  Result Date: 12/05/2015 CLINICAL DATA:  45 year old male with cough and anasarca EXAM: PORTABLE CHEST 1 VIEW COMPARISON:  CT dated 05/05/2010 FINDINGS: There is mild cardiomegaly with increased vascular and interstitial prominence compatible with congestive changes and mild interstitial edema. Superimposed pneumonia is not excluded. No focal consolidation, pleural effusion, or pneumothorax. No acute osseous pathology. IMPRESSION: Mild cardiomegaly and congestive changes.  No focal consolidation. Electronically Signed   By: Elgie Collard M.D.   On: 12/05/2015 00:39     STUDIES:  8/2 LEDS>>  CULTURES:   ANTIBIOTICS: 8/1 levaquin>>  SIGNIFICANT EVENTS:   LINES/TUBES:   DISCUSSION: 45  yo male , smoker, heavy drinker, non compliant with Afib medication(coumadin) due to loss of job. Presents 8/1 with CC: lower ext edema and abdominal discomfort. CT abd revealed anasarca , hepatomegaly, and he was given lasix fro lower ext edema. 8/2 early am he developed afib with RVR 150's and hypotension 80/50 and was given lasix with no response. PCCM called to bedside for hypotension. He is awake and alert. NAD at rest bur systolic bp is 90. Hold lasix, treat with albumin, anticoagulation (inr1.99), further cardiac evaluation ordered. We wil Doppler lower exts for completeness. We will follow in SDU with you.  ASSESSMENT / PLAN:  PULMONARY A: No acute  issue P:   O2 as needed  CARDIOVASCULAR A:  PAF with AF RVR, non compliant with medications Suspected intravascular dry Hypotension P:  Stop lasix Albumin as ordered Gentle hydration 2d and cards consult Transfer to SDU LEDS ro DVT  RENAL A:   New onset of renal failure Suspect intervascular dry P:   Hold lasix Albumin and IVF, resume lasix when more HD stable  GASTROINTESTINAL A:   Ascites from liver dysfunction GI protection P:   PPI Hold on paracentesis till stabilized   HEMATOLOGIC A:   Non compliant with coumadin for PAF P:  Will need anticoagulation   INFECTIOUS A:   Elevated lactic acid 5.2 on admit with WBC 9.0 and non febrile. On Levaquin per [facility] service. P:   Low threshold to dc abx  ENDOCRINE A:   No acute issues   P:   Follow glucose   NEUROLOGIC A:   Intact, RASS 1 Follows commands and mae x 4 P:   RASS goal:1 Limit sedation   FAMILY  - Updates:   - Inter-disciplinary family meet or Palliative Care meeting due by:  8/9    Brett Canales Minor ACNP Adolph Pollack PCCM Pager [phone] till 3 pm If no answer page [phone] 12/05/2015, 8:46 AM  STAFF NOTE: I, Rory Percy, MD FACP have personally reviewed patient's available data, including medical history, events of note, physical examination and test results as part of my evaluation. I have discussed with resident/NP and other care providers such as pharmacist, RN and RRT. In addition, I personally evaluated patient and elicited key findings of: awake, was walking in the room, ascites, 4 plus edema lower ext, nonfocal, asymptomatic a t present with borderline BP, unclear to me what his intervascular volume status is, would hold lasix, in setting of fluid wave and CT review of ascites would consider albumin challenge, consider 2 doses, get cortisol, ensure TSH, echo pending, get lower ext dopplers ( regardeless of fib as chronic), in setting likely hypercoagulable state from liver dz, move to  sdu, he likely does not tolerate fib well, lactic hard to interpret in setting liver dz, dont think this is sepsis, does not appear to be infected, will follow , in setting cardiac, liver dz would tolerate MAP 55 with good MS   Mcarthur Rossetti. Tyson Alias, MD, FACP Pgr: [phone] [facility] 12/05/2015 6:36 PM

## 2015-12-05 NOTE — Progress Notes (Signed)
eLink Physician-Brief Progress Note Patient Name: Austin [name] DOB: 10-24-1970 MRN: [id number]   Date of Service  12/05/2015  HPI/Events of Note  PCCM being asked to see pt for ICU admission. ETOH drinker, with rapid afib, with hypotension, anasarca, AKI. Suspect systolic CHF, cardio renal syndrome.   eICU Interventions  PCCM team notified. Labs ordered. Albumin has been ordered.  Need echo. Not sure if pt will need inotropes.         Jose Bridgette Habermann Dios 12/05/2015, 7:14 AM

## 2015-12-06 ENCOUNTER — Encounter (HOSPITAL_COMMUNITY): Payer: Self-pay

## 2015-12-06 LAB — COMPREHENSIVE METABOLIC PANEL
ALT: 19 U/L (ref 17–63)
AST: 48 U/L — AB (ref 15–41)
Albumin: 2.7 g/dL — ABNORMAL LOW (ref 3.5–5.0)
Alkaline Phosphatase: 91 U/L (ref 38–126)
Anion gap: 14 (ref 5–15)
BILIRUBIN TOTAL: 8.6 mg/dL — AB (ref 0.3–1.2)
BUN: 56 mg/dL — AB (ref 6–20)
CHLORIDE: 80 mmol/L — AB (ref 101–111)
CO2: 31 mmol/L (ref 22–32)
CREATININE: 2.6 mg/dL — AB (ref 0.61–1.24)
Calcium: 8.5 mg/dL — ABNORMAL LOW (ref 8.9–10.3)
GFR, EST AFRICAN AMERICAN: 32 mL/min — AB (ref >60)
GFR, EST NON AFRICAN AMERICAN: 28 mL/min — AB (ref >60)
Glucose, Bld: 126 mg/dL — ABNORMAL HIGH (ref 65–99)
Potassium: 3.5 mmol/L (ref 3.5–5.1)
Sodium: 125 mmol/L — ABNORMAL LOW (ref 135–145)
TOTAL PROTEIN: 5.6 g/dL — AB (ref 6.5–8.1)

## 2015-12-06 LAB — CBC WITH DIFFERENTIAL/PLATELET
BASOS ABS: 0 10*3/uL (ref 0.0–0.1)
Basophils Relative: 0 %
EOS ABS: 0.1 10*3/uL (ref 0.0–0.7)
EOS PCT: 1 %
HCT: 40.7 % (ref 39.0–52.0)
Hemoglobin: 14.6 g/dL (ref 13.0–17.0)
LYMPHS ABS: 1.3 10*3/uL (ref 0.7–4.0)
Lymphocytes Relative: 16 %
MCH: 40.4 pg — ABNORMAL HIGH (ref 26.0–34.0)
MCHC: 35.9 g/dL (ref 30.0–36.0)
MCV: 112.7 fL — ABNORMAL HIGH (ref 78.0–100.0)
MONO ABS: 0.6 10*3/uL (ref 0.1–1.0)
Monocytes Relative: 7 %
NEUTROS PCT: 76 %
Neutro Abs: 6.4 10*3/uL (ref 1.7–7.7)
PLATELETS: 107 10*3/uL — AB (ref 150–400)
RBC: 3.61 MIL/uL — AB (ref 4.22–5.81)
RDW: 16.1 % — AB (ref 11.5–15.5)
WBC: 8.4 10*3/uL (ref 4.0–10.5)

## 2015-12-06 LAB — URINE CULTURE: CULTURE: NO GROWTH

## 2015-12-06 LAB — HEPATITIS PANEL, ACUTE
HCV Ab: <0.1 s/co ratio (ref 0.0–0.9)
Hep A IgM: NEGATIVE
Hep B C IgM: NEGATIVE
Hepatitis B Surface Ag: NEGATIVE

## 2015-12-06 LAB — CORTISOL: CORTISOL PLASMA: 23.9 ug/dL

## 2015-12-06 MED ORDER — AMIODARONE HCL 200 MG PO TABS
400.0000 mg | ORAL_TABLET | Freq: Two times a day (BID) | ORAL | Status: DC
  Administered 2015-12-06 – 2015-12-08 (×5): 400 mg via ORAL
  Filled 2015-12-06 (×5): qty 2

## 2015-12-06 MED ORDER — LEVOFLOXACIN 750 MG PO TABS
750.0000 mg | ORAL_TABLET | ORAL | Status: DC
  Administered 2015-12-06 – 2015-12-10 (×3): 750 mg via ORAL
  Filled 2015-12-06 (×4): qty 1

## 2015-12-06 NOTE — Progress Notes (Signed)
Patient Name: Austin [name] Date of Encounter: 12/06/2015  45 yo [place] man with ongoing alcohol abuse (until 2 days ago) and history of AF treated with warfarin and Dilt four years ago, presents today with worsening swelling in his legs for the past couple weeks    SUBJECTIVE  Feels weak. No chest pain or sob.   Echo shows moderate LV dysfunction - EF 35-40% Moderate PHTN    CURRENT MEDS . folic acid  1 mg Oral Daily  . levofloxacin (LEVAQUIN) IV  750 mg Intravenous Q48H  . LORazepam  0-4 mg Oral Q6H   Followed by  . [START ON 12/07/2015] LORazepam  0-4 mg Oral Q12H  . multivitamin with minerals  1 tablet Oral Daily  . thiamine  100 mg Oral Daily   Or  . thiamine  100 mg Intravenous Daily    OBJECTIVE  Vitals:   12/06/15 0500 12/06/15 0525 12/06/15 0700 12/06/15 0822  BP: 98/73 98/73 95/78  (!) 82/68  Pulse: (!) 124 (!) 122 (!) 117   Resp: 19 20 (!) 30 (!) 22  Temp: 97.4 F (36.3 C)   98.1 F (36.7 C)  TempSrc: Oral   Oral  SpO2: 97% 96% 98% 96%  Weight:  202 lb 13.2 oz (92 kg)    Height:  6\' 3"  (1.905 m)      Intake/Output Summary (Last 24 hours) at 12/06/15 0842 Last data filed at 12/06/15 0500  Gross per 24 hour  Intake              120 ml  Output              750 ml  Net             -630 ml   Filed Weights   12/04/15 2217 12/05/15 0545 12/06/15 0525  Weight: 203 lb 0.7 oz (92.1 kg) 203 lb 7.8 oz (92.3 kg) 202 lb 13.2 oz (92 kg)    PHYSICAL EXAM  General: ill appearing male in NAD.   Skin is sallow / yellow in color   Neuro: Alert and oriented X 3. Moves all extremities spontaneously. Psych: Normal affect. HEENT:  Normal  Neck: Supple without bruits. + JVD. Lungs:  Resp regular and unlabored. Inspiratory course breath sound with diffuse rales Heart: Ir Ir tachycardic no s3, s4,  2-3/6 systolic murmur radiating to the axilla and ULSB.   Abdomen: Soft, non-tender, non-distended, BS + x 4.  Extremities: No clubbing, cyanosis. 3+ BL LE  Edema upto  thighs. DP/PT/Radials 2+ and equal bilaterally.  Accessory Clinical Findings  CBC  Recent Labs  12/04/15 1355 12/05/15 0718  WBC 10.3 9.0  HGB 14.6 14.5  HCT 41.2 40.5  MCV 113.2* 110.7*  PLT 134* 121*   Basic Metabolic Panel  Recent Labs  12/04/15 1355 12/04/15 2337 12/05/15 0718  NA 125*  --  126*  125*  K 3.5  --  3.8  3.9  CL 78*  --  78*  78*  CO2 24  --  33*  32  GLUCOSE 116*  --  104*  104*  BUN 49*  --  50*  51*  CREATININE 2.61*  --  2.64*  2.58*  CALCIUM 8.5*  --  8.7*  8.7*  MG  --  1.7 1.7  PHOS  --   --  4.2   Liver Function Tests  Recent Labs  12/04/15 1355 12/05/15 0718  AST 57* 51*  ALT 27 24  ALKPHOS 99  98  BILITOT 12.2* 9.7*  PROT 6.5 5.9*  ALBUMIN 3.0* 2.8*    Recent Labs  12/04/15 1355  LIPASE 18   Cardiac Enzymes  Recent Labs  12/04/15 2337 12/05/15 0718 12/05/15 1026  TROPONINI 0.16* 0.14* 0.18*   BNP Invalid input(s): POCBNP D-Dimer No results for input(s): DDIMER in the last 72 hours. Hemoglobin A1C No results for input(s): HGBA1C in the last 72 hours. Fasting Lipid Panel No results for input(s): CHOL, HDL, LDLCALC, TRIG, CHOLHDL, LDLDIRECT in the last 72 hours. Thyroid Function Tests  Recent Labs  12/05/15 0012  TSH 1.724    TELE  afib at rate of 110-120s  Radiology/Studies  Ct Abdomen Pelvis Wo Contrast  Result Date: 12/04/2015 CLINICAL DATA:  45 year old male with increased lower extremity swelling and abdominal pain. Jaundice. Initial encounter. EXAM: CT ABDOMEN AND PELVIS WITHOUT CONTRAST TECHNIQUE: Multidetector CT imaging of the abdomen and pelvis was performed following the standard protocol without IV contrast. COMPARISON:  CT chest abdomen and pelvis 05/05/2010. FINDINGS: New small to moderate bilateral layering pleural effusions with compressive lower lobe atelectasis. Cardiomegaly appears new since 2012. No pericardial effusion. No acute osseous abnormality identified. Anasarca, with  diffuse abdominal wall and dependent flank subcutaneous stranding. Moderate volume simple appearing free fluid in the pelvis. Small volume free fluid throughout the abdomen, including perihepatic and perisplenic fluid. Negative rectum and urinary bladder. Occasional sigmoid diverticula with no active inflammation. Decompressed left colon. Mild wall thickening of the transverse colon which might be reactive. More normal appearance of the right colon. Negative appendix. Decompressed terminal ileum. Oral contrast in the small bowel, but has not yet reached the TI. Small volume of oral contrast in the distal esophagus. Negative stomach. Decompressed duodenum. Mild hepatomegaly which is new since 2012. No discrete liver lesion identified in the absence of contrast. No intrahepatic or extrahepatic biliary ductal dilatation is evident on this noncontrast exam. Increased density within the gallbladder might be sludge. The main portal vein does not appear enlarged. Negative noncontrast spleen, no splenomegaly. Negative noncontrast pancreas and adrenal glands. Negative noncontrast kidneys. Aortoiliac calcified atherosclerosis noted. No lymphadenopathy identified. IMPRESSION: 1. Anasarca with abdominal and pelvic ascites and layering pleural effusions (small to moderate volume). 2. Cardiomegaly and hepatomegaly are new since 2012. No discrete liver lesion in the absence of contrast. No findings to suggest chronic portal hypertension. The top differential considerations in this clinical setting include heart failure with congestive hepatopathy versus acute or or chronic hepatitis. 3. Calcified aortic atherosclerosis. 4. Suggestion of gallbladder sludge. Electronically Signed   By: Odessa Fleming M.D.   On: 12/04/2015 20:49   US Abdomen Complete  Result Date: 12/05/2015 CLINICAL DATA:  Patient with elevated LFTs. Anasarca. Alcohol abuse. EXAM: ABDOMEN ULTRASOUND COMPLETE COMPARISON:  CT abdomen pelvis 12/04/2015 FINDINGS:  Gallbladder: Sludge within the gallbladder lumen. No gallbladder wall thickening or pericholecystic fluid. Negative sonographic Murphy's sign. Common bile duct: Diameter: 5 mm Liver: Diffusely increased in echogenicity. No focal lesion identified. IVC: No abnormality visualized. Pancreas: Visualized portion unremarkable. Spleen: Size and appearance within normal limits. Right Kidney: Length: 12.0 cm. Echogenicity within normal limits. No mass or hydronephrosis visualized. Left Kidney: Length: 12.4 cm. Echogenicity within normal limits. No mass or hydronephrosis visualized. Abdominal aorta: No aneurysm visualized. Other findings: Diffuse ascites.  Bilateral pleural effusions. IMPRESSION: Gallbladder sludge. No secondary signs to suggest acute cholecystitis. Hepatic steatosis. Ascites. Bilateral pleural effusions. Electronically Signed   By: Annia Belt M.D.   On: 12/05/2015 17:16   Dg Chest [facility]  1 View  Result Date: 12/05/2015 CLINICAL DATA:  45 year old male with cough and anasarca EXAM: PORTABLE CHEST 1 VIEW COMPARISON:  CT dated 05/05/2010 FINDINGS: There is mild cardiomegaly with increased vascular and interstitial prominence compatible with congestive changes and mild interstitial edema. Superimposed pneumonia is not excluded. No focal consolidation, pleural effusion, or pneumothorax. No acute osseous pathology. IMPRESSION: Mild cardiomegaly and congestive changes.  No focal consolidation. Electronically Signed   By: Elgie Collard M.D.   On: 12/05/2015 00:39    ASSESSMENT AND PLAN   1. Afib with RVR - Hx of PAF. Self discontinued warfarin about 2 years ago due to lack of insurance as he lost his job. Not on any rate control agent due to hypotension.  Slowing his HR will be a challenge.   His BP is very low  I dont think he will tolerate beta blocker Considering amiodarone - he has significant liver disease and the amio could potentially cause some problems - perhaps short term amio to help slow  the HR  His lack of compliance will be an issue if we use amiodarone long term .  He has had AF for > 5 years and I doubt that he will convert - even if electrically cardioverted I doubt that he will stay in sinus rhythm.     2. Anasarca - Given IV lasix 40mg  x 1. However net diuresis of +258ml. No discontinued due to hypotension and renal failure and suspected intravascular dry.    3. Chronic systolic CHF:   Likely multifactorial - hx of heavy ETOH abuse, long standing rapid atrial fib, ? CAD with long standing hx of cigarette smoking    Kristeen Miss, MD  12/06/2015 8:42 AM    [facility] [address] [place],  [address] [place], [place]  [address] Pager [phone] Phone: [phone]; Fax: [phone]

## 2015-12-06 NOTE — Consult Note (Signed)
PULMONARY / CRITICAL CARE MEDICINE   Name: Austin [name] MRN: [id number] DOB: 1970/11/15    ADMISSION DATE:  12/04/2015 CONSULTATION DATE: June Leap  REFERRING MD: [facility]  CHIEF COMPLAINT: Abd tenderness  HISTORY OF PRESENT ILLNESS:   45 yo male , smoker, heavy drinker, non compliant with Afib medication(coumadin) due to loss of job. Presents 8/1 with CC: lower ext edema and abdominal discomfort. CT abd revealed anasarca , hepatomegaly, and he was given lasix fro lower ext edema. 8/2 early am he developed afib with RVR 150's and hypotension 80/50 and was given lasix with no response. PCCM called to bedside for hypotension.   PAST MEDICAL HISTORY :  He  has a past medical history of Alcoholic hepatitis; Anasarca (12/2015); ARF (acute renal failure) (HCC) (12/2015); and Atrial fibrillation (HCC).  PAST SURGICAL HISTORY: He  has a past surgical history that includes arm surgery.  Allergies  Allergen Reactions  . Penicillins     Has patient had a PCN reaction causing immediate rash, facial/tongue/throat swelling, SOB or lightheadedness with hypotension: YES Has patient had a PCN reaction causing severe rash involving mucus membranes or skin necrosis: NO Has patient had a PCN reaction that required hospitalization NO Has patient had a PCN reaction occurring within the last 10 years: NO If all of the above answers are "NO", then may proceed with Cephalosporin use.    No current facility-administered medications on file prior to encounter.    No current outpatient prescriptions on file prior to encounter.    FAMILY HISTORY:  His indicated that the status of his paternal uncle is unknown.    SOCIAL HISTORY: He  reports that he has been smoking Cigarettes.  He has a 25.00 pack-year smoking history. He has never used smokeless tobacco. He reports that he drinks about 50.4 oz of alcohol per week . He reports that he does not use drugs.  REVIEW OF SYSTEMS:   10 point review of system taken,  please see HPI for positives and negatives.  SUBJECTIVE:  Feels well with no complaints  VITAL SIGNS: BP 92/77   Pulse (!) 30   Temp 98.1 F (36.7 C) (Oral)   Resp 15   Ht  (1.905 m)   Wt 202 lb 13.2 oz (92 kg)   SpO2 100%   BMI 25.35 kg/m   HEMODYNAMICS:    VENTILATOR SETTINGS:    INTAKE / OUTPUT: I/O last 3 completed shifts: In: 520 [P.O.:520] Out: 925 [Urine:925]  PHYSICAL EXAMINATION: General:  Appears older than age.  Neuro:  No focal deficits HEENT:  +JVD, No lymphadenopathy Cardiovascular:  Irregular, no MRG Lungs:Clear, No wheeze or crackles Abdomen:  Distended , tender diffusely , increased ruq tenderness, + fluid wave with ascites Musculoskeletal: intact Skin:  3+ lower ext pitting edema, No rash  LABS:  BMET  Recent Labs Lab 12/04/15 1355 12/05/15 0718  NA 125* 126*  125*  K 3.5 3.8  3.9  CL 78* 78*  78*  CO2 24 33*  32  BUN 49* 50*  51*  CREATININE 2.61* 2.64*  2.58*  GLUCOSE 116* 104*  104*    Electrolytes  Recent Labs Lab 12/04/15 1355 12/04/15 2337 12/05/15 0718  CALCIUM 8.5*  --  8.7*  8.7*  MG  --  1.7 1.7  PHOS  --   --  4.2    CBC  Recent Labs Lab 12/04/15 1355 12/05/15 0718  WBC 10.3 9.0  HGB 14.6 14.5  HCT 41.2 40.5  PLT 134*  121*    Coag's  Recent Labs Lab 12/04/15 2137  INR 1.99    Sepsis Markers  Recent Labs Lab 12/05/15 0718 12/05/15 0943  LATICACIDVEN 3.7* 3.4*  PROCALCITON 1.48  --     ABG No results for input(s): PHART, PCO2ART, PO2ART in the last 168 hours.  Liver Enzymes  Recent Labs Lab 12/04/15 1355 12/05/15 0718  AST 57* 51*  ALT 27 24  ALKPHOS 99 98  BILITOT 12.2* 9.7*  ALBUMIN 3.0* 2.8*    Cardiac Enzymes  Recent Labs Lab 12/04/15 2337 12/05/15 0718 12/05/15 1026  TROPONINI 0.16* 0.14* 0.18*    Glucose No results for input(s): GLUCAP in the last 168 hours.  Imaging US Abdomen Complete  Result Date: 12/05/2015 CLINICAL DATA:  Patient with  elevated LFTs. Anasarca. Alcohol abuse. EXAM: ABDOMEN ULTRASOUND COMPLETE COMPARISON:  CT abdomen pelvis 12/04/2015 FINDINGS: Gallbladder: Sludge within the gallbladder lumen. No gallbladder wall thickening or pericholecystic fluid. Negative sonographic Murphy's sign. Common bile duct: Diameter: 5 mm Liver: Diffusely increased in echogenicity. No focal lesion identified. IVC: No abnormality visualized. Pancreas: Visualized portion unremarkable. Spleen: Size and appearance within normal limits. Right Kidney: Length: 12.0 cm. Echogenicity within normal limits. No mass or hydronephrosis visualized. Left Kidney: Length: 12.4 cm. Echogenicity within normal limits. No mass or hydronephrosis visualized. Abdominal aorta: No aneurysm visualized. Other findings: Diffuse ascites.  Bilateral pleural effusions. IMPRESSION: Gallbladder sludge. No secondary signs to suggest acute cholecystitis. Hepatic steatosis. Ascites. Bilateral pleural effusions. Electronically Signed   By: Annia Belt M.D.   On: 12/05/2015 17:16     STUDIES:  8/2 LEDS>> 8/2 Echo 8/2 >> Reduced LVEF 35-40%, diffuse hypokinesis. PAP 65mm hg  CULTURES:   ANTIBIOTICS: 8/1 levaquin>>  SIGNIFICANT EVENTS:   LINES/TUBES:   DISCUSSION: 45 yo male , smoker, heavy drinker, cirrhosis, non compliant with Afib medication(coumadin) due to loss of job. Presents 8/1 with CC: lower ext edema and abdominal discomfort. CT abd revealed anasarca , hepatomegaly.  Transferred to SDU for hypotension, rapid a fib.  Shock is likely from volume depletion, a fib. There is no evidence of sepsis. BP has improved with albumin. Cardiology is following for a fib.   ASSESSMENT / PLAN:  PULMONARY A: No acute issue P:   O2 as needed  CARDIOVASCULAR A:  PAF with AF RVR, non compliant with medications Suspected intravascular dry Hypotension P:  Albumin as needed Follow LE dopplers to r/o DVT  RENAL A:   New onset of renal failure Suspect  intervascular dry P:   Albumin and IVF, resume lasix when more HD stable  GASTROINTESTINAL A:   Ascites from liver dysfunction GI protection P:   PPI  HEMATOLOGIC A:   Non compliant with coumadin for PAF P:  May need anticoagulation  INFECTIOUS A:   Elevated lactic acid 5.2 on admit with WBC 9.0 and non febrile. On Levaquin per [facility] service. P:   Low threshold to dc abx  ENDOCRINE A:   No acute issues   P:   Follow glucose   NEUROLOGIC A:   Intact, RASS 1 Follows commands and mae x 4 P:   RASS goal:1 Limit sedation   FAMILY  - Updates:  - Inter-disciplinary family meet or Palliative Care meeting due by:  8/9  Chilton Greathouse MD [facility] Pager [phone] If no answer or after 3pm call: [phone] 12/06/2015, 11:10 AM

## 2015-12-06 NOTE — Progress Notes (Addendum)
PROGRESS NOTE    Austin [name]  JYN:[id number] DOB: 1971/01/19 DOA: 12/04/2015 PCP: Aida Puffer, MD    Brief Narrative:  Pt is a 45 y/o male with alcohol abuse and previous history of atrial fibrillation presents to the ER because of increasing lower extremity edema over the last couple weeks  Assessment & Plan:   Principal Problem:   Anasarca - Lasix discontinue secondary to worsening renal function suspected low intravascular volume and hypotension.  Hypotension - Critical care team consulted and on board. - Will continue to monitor. Agree with SDU transfer.   Active Problems:   Atrial fibrillation with RVR [facility]) - cardiology on board and managing    Alcoholic hepatitis - Most likely causing principle problem    ARF (acute renal failure) (HCC) - Currently stable, will avoid nephrotoxic agents   DVT prophylaxis: auto anticoagulated last inr 1.9 Code Status: Full Family Communication: d/c patient directly Disposition Plan: pending hospital course.    Consultants:   Critical care  Cardiology   Procedures: None   Antimicrobials: levaquin   Subjective: Patient has no new complaints today.  Objective: Vitals:   12/06/15 1000 12/06/15 1100 12/06/15 1114 12/06/15 1200  BP: 92/77 (!) 87/76 (!) 87/76 (!) 88/61  Pulse: (!) 30 (!) 44  75  Resp: 15 16 (!) 21 15  Temp:   97.5 F (36.4 C)   TempSrc:   Oral   SpO2: 100% 98% 98% 99%  Weight:      Height:        Intake/Output Summary (Last 24 hours) at 12/06/15 1317 Last data filed at 12/06/15 0500  Gross per 24 hour  Intake              120 ml  Output              650 ml  Net             -530 ml   Filed Weights   12/04/15 2217 12/05/15 0545 12/06/15 0525  Weight: 92.1 kg (203 lb 0.7 oz) 92.3 kg (203 lb 7.8 oz) 92 kg (202 lb 13.2 oz)    Examination:  General exam: Appears calm and comfortable  Respiratory system: Clear to auscultation. Respiratory effort normal. Cardiovascular system: S1 & S2  heard, RRR. No JVD, murmurs, rubs, gallops or clicks. No pedal edema. Gastrointestinal system: Abdomen is + distended, soft and nontender. No organomegaly or masses felt. Normal bowel sounds heard. Central nervous system: Awake and alert, no facial asymmetry, answers questions appropriately  Extremities: Symmetric 5 x 5 power. Skin: No rashes, lesions or ulcers Psychiatry: Judgement and insight appear normal. Mood & affect appropriate.   Data Reviewed: I have personally reviewed following labs and imaging studies  CBC:  Recent Labs Lab 12/04/15 1355 12/05/15 0718 12/06/15 1050  WBC 10.3 9.0 8.4  NEUTROABS  --   --  6.4  HGB 14.6 14.5 14.6  HCT 41.2 40.5 40.7  MCV 113.2* 110.7* 112.7*  PLT 134* 121* 107*   Basic Metabolic Panel:  Recent Labs Lab 12/04/15 1355 12/04/15 2337 12/05/15 0718 12/06/15 1050  NA 125*  --  126*  125* 125*  K 3.5  --  3.8  3.9 3.5  CL 78*  --  78*  78* 80*  CO2 24  --  33*  32 31  GLUCOSE 116*  --  104*  104* 126*  BUN 49*  --  50*  51* 56*  CREATININE 2.61*  --  2.64*  2.58* 2.60*  CALCIUM 8.5*  --  8.7*  8.7* 8.5*  MG  --  1.7 1.7  --   PHOS  --   --  4.2  --    GFR: Estimated Creatinine Clearance: 42 mL/min (by C-G formula based on SCr of 2.6 mg/dL). Liver Function Tests:  Recent Labs Lab 12/04/15 1355 12/05/15 0718 12/06/15 1050  AST 57* 51* 48*  ALT 27 24 19   ALKPHOS 99 98 91  BILITOT 12.2* 9.7* 8.6*  PROT 6.5 5.9* 5.6*  ALBUMIN 3.0* 2.8* 2.7*    Recent Labs Lab 12/04/15 1355  LIPASE 18   No results for input(s): AMMONIA in the last 168 hours. Coagulation Profile:  Recent Labs Lab 12/04/15 2137  INR 1.99   Cardiac Enzymes:  Recent Labs Lab 12/04/15 2337 12/05/15 0718 12/05/15 1026  TROPONINI 0.16* 0.14* 0.18*   BNP (last 3 results) No results for input(s): PROBNP in the last 8760 hours. HbA1C: No results for input(s): HGBA1C in the last 72 hours. CBG: No results for input(s): GLUCAP in the last  168 hours. Lipid Profile: No results for input(s): CHOL, HDL, LDLCALC, TRIG, CHOLHDL, LDLDIRECT in the last 72 hours. Thyroid Function Tests:  Recent Labs  12/05/15 0012  TSH 1.724   Anemia Panel: No results for input(s): VITAMINB12, FOLATE, FERRITIN, TIBC, IRON, RETICCTPCT in the last 72 hours. Sepsis Labs:  Recent Labs Lab 12/05/15 0718 12/05/15 0943  PROCALCITON 1.48  --   LATICACIDVEN 3.7* 3.4*    Recent Results (from the past 240 hour(s))  Culture, blood (Routine X 2) w Reflex to ID Panel     Status: None (Preliminary result)   Collection Time: 12/04/15  9:32 PM  Result Value Ref Range Status   Specimen Description BLOOD LEFT ANTECUBITAL  Final   Special Requests BOTTLES DRAWN AEROBIC AND ANAEROBIC 5CC  Final   Culture NO GROWTH 2 DAYS  Final   Report Status PENDING  Incomplete  Culture, blood (Routine X 2) w Reflex to ID Panel     Status: None (Preliminary result)   Collection Time: 12/04/15  9:34 PM  Result Value Ref Range Status   Specimen Description BLOOD RIGHT HAND  Final   Special Requests IN PEDIATRIC BOTTLE 2CC  Final   Culture NO GROWTH 2 DAYS  Final   Report Status PENDING  Incomplete  Culture, Urine     Status: None   Collection Time: 12/05/15  6:58 AM  Result Value Ref Range Status   Specimen Description URINE, RANDOM  Final   Special Requests NONE  Final   Culture NO GROWTH  Final   Report Status 12/06/2015 FINAL  Final  MRSA PCR Screening     Status: None   Collection Time: 12/05/15 11:38 AM  Result Value Ref Range Status   MRSA by PCR NEGATIVE NEGATIVE Final    Comment:        The GeneXpert MRSA Assay (FDA approved for NASAL specimens only), is one component of a comprehensive MRSA colonization surveillance program. It is not intended to diagnose MRSA infection nor to guide or monitor treatment for MRSA infections.          Radiology Studies: Ct Abdomen Pelvis Wo Contrast  Result Date: 12/04/2015 CLINICAL DATA:  45 year old male  with increased lower extremity swelling and abdominal pain. Jaundice. Initial encounter. EXAM: CT ABDOMEN AND PELVIS WITHOUT CONTRAST TECHNIQUE: Multidetector CT imaging of the abdomen and pelvis was performed following the standard protocol without IV contrast. COMPARISON:  CT chest abdomen and pelvis  05/05/2010. FINDINGS: New small to moderate bilateral layering pleural effusions with compressive lower lobe atelectasis. Cardiomegaly appears new since 2012. No pericardial effusion. No acute osseous abnormality identified. Anasarca, with diffuse abdominal wall and dependent flank subcutaneous stranding. Moderate volume simple appearing free fluid in the pelvis. Small volume free fluid throughout the abdomen, including perihepatic and perisplenic fluid. Negative rectum and urinary bladder. Occasional sigmoid diverticula with no active inflammation. Decompressed left colon. Mild wall thickening of the transverse colon which might be reactive. More normal appearance of the right colon. Negative appendix. Decompressed terminal ileum. Oral contrast in the small bowel, but has not yet reached the TI. Small volume of oral contrast in the distal esophagus. Negative stomach. Decompressed duodenum. Mild hepatomegaly which is new since 2012. No discrete liver lesion identified in the absence of contrast. No intrahepatic or extrahepatic biliary ductal dilatation is evident on this noncontrast exam. Increased density within the gallbladder might be sludge. The main portal vein does not appear enlarged. Negative noncontrast spleen, no splenomegaly. Negative noncontrast pancreas and adrenal glands. Negative noncontrast kidneys. Aortoiliac calcified atherosclerosis noted. No lymphadenopathy identified. IMPRESSION: 1. Anasarca with abdominal and pelvic ascites and layering pleural effusions (small to moderate volume). 2. Cardiomegaly and hepatomegaly are new since 2012. No discrete liver lesion in the absence of contrast. No  findings to suggest chronic portal hypertension. The top differential considerations in this clinical setting include heart failure with congestive hepatopathy versus acute or or chronic hepatitis. 3. Calcified aortic atherosclerosis. 4. Suggestion of gallbladder sludge. Electronically Signed   By: Odessa Fleming M.D.   On: 12/04/2015 20:49   US Abdomen Complete  Result Date: 12/05/2015 CLINICAL DATA:  Patient with elevated LFTs. Anasarca. Alcohol abuse. EXAM: ABDOMEN ULTRASOUND COMPLETE COMPARISON:  CT abdomen pelvis 12/04/2015 FINDINGS: Gallbladder: Sludge within the gallbladder lumen. No gallbladder wall thickening or pericholecystic fluid. Negative sonographic Murphy's sign. Common bile duct: Diameter: 5 mm Liver: Diffusely increased in echogenicity. No focal lesion identified. IVC: No abnormality visualized. Pancreas: Visualized portion unremarkable. Spleen: Size and appearance within normal limits. Right Kidney: Length: 12.0 cm. Echogenicity within normal limits. No mass or hydronephrosis visualized. Left Kidney: Length: 12.4 cm. Echogenicity within normal limits. No mass or hydronephrosis visualized. Abdominal aorta: No aneurysm visualized. Other findings: Diffuse ascites.  Bilateral pleural effusions. IMPRESSION: Gallbladder sludge. No secondary signs to suggest acute cholecystitis. Hepatic steatosis. Ascites. Bilateral pleural effusions. Electronically Signed   By: Annia Belt M.D.   On: 12/05/2015 17:16   Dg Chest Port 1 View  Result Date: 12/05/2015 CLINICAL DATA:  45 year old male with cough and anasarca EXAM: PORTABLE CHEST 1 VIEW COMPARISON:  CT dated 05/05/2010 FINDINGS: There is mild cardiomegaly with increased vascular and interstitial prominence compatible with congestive changes and mild interstitial edema. Superimposed pneumonia is not excluded. No focal consolidation, pleural effusion, or pneumothorax. No acute osseous pathology. IMPRESSION: Mild cardiomegaly and congestive changes.  No focal  consolidation. Electronically Signed   By: Elgie Collard M.D.   On: 12/05/2015 00:39        Scheduled Meds: . amiodarone  400 mg Oral BID  . folic acid  1 mg Oral Daily  . levofloxacin  750 mg Oral Q48H  . LORazepam  0-4 mg Oral Q6H   Followed by  . [START ON 12/07/2015] LORazepam  0-4 mg Oral Q12H  . multivitamin with minerals  1 tablet Oral Daily  . thiamine  100 mg Oral Daily   Or  . thiamine  100 mg Intravenous Daily   Continuous  Infusions:    LOS: 2 days    Time spent: > 35 minutes    Penny Pia, MD [facility] Pager [phone]  If 7PM-7AM, please contact night-coverage [email or web address] Password [email or web address] 12/06/2015, 1:17 PM

## 2015-12-07 ENCOUNTER — Inpatient Hospital Stay (HOSPITAL_COMMUNITY): Payer: Medicaid Other

## 2015-12-07 DIAGNOSIS — R0602 Shortness of breath: Secondary | ICD-10-CM

## 2015-12-07 DIAGNOSIS — I5022 Chronic systolic (congestive) heart failure: Secondary | ICD-10-CM

## 2015-12-07 LAB — PROCALCITONIN: PROCALCITONIN: 0.89 ng/mL

## 2015-12-07 MED ORDER — UNJURY CHICKEN SOUP POWDER
2.0000 [oz_av] | Freq: Four times a day (QID) | ORAL | Status: DC
  Administered 2015-12-07: 2 [oz_av] via ORAL
  Administered 2015-12-07: 7 [oz_av] via ORAL
  Administered 2015-12-08 – 2015-12-10 (×4): 2 [oz_av] via ORAL
  Filled 2015-12-07 (×18): qty 27

## 2015-12-07 MED ORDER — LEVALBUTEROL HCL 0.63 MG/3ML IN NEBU
0.6300 mg | INHALATION_SOLUTION | Freq: Three times a day (TID) | RESPIRATORY_TRACT | Status: DC | PRN
  Administered 2015-12-07 – 2015-12-08 (×2): 0.63 mg via RESPIRATORY_TRACT
  Filled 2015-12-07 (×2): qty 3

## 2015-12-07 MED ORDER — ALBUTEROL SULFATE (2.5 MG/3ML) 0.083% IN NEBU: 2.5000 mg | INHALATION_SOLUTION | Freq: Four times a day (QID) | RESPIRATORY_TRACT | Status: DC | PRN

## 2015-12-07 MED ORDER — METOPROLOL TARTRATE 12.5 MG HALF TABLET
12.5000 mg | ORAL_TABLET | Freq: Two times a day (BID) | ORAL | Status: DC
  Administered 2015-12-07 – 2015-12-08 (×3): 12.5 mg via ORAL
  Filled 2015-12-07 (×3): qty 1

## 2015-12-07 NOTE — Progress Notes (Signed)
Dr. Orson Aloe notified of pt having 13 beat run of VTACH, no orders received

## 2015-12-07 NOTE — Progress Notes (Signed)
VASCULAR LAB PRELIMINARY  PRELIMINARY  PRELIMINARY  PRELIMINARY  Bilateral lower extremity venous duplex has been completed.     Bilateral:  No evidence of DVT, superficial thrombosis, or Baker's Cyst.  Gave results to [place], RN  Jenetta Loges, RVT, RDMS 12/07/2015, 2:21 PM

## 2015-12-07 NOTE — Progress Notes (Signed)
Called that patient was off monitor when I went in room patient was on the floor. State that he hit his head when he fell, No bruises noted for he was on his right side, Staff help get him back to bed , bed alarm was on and he crawled over rales  Which was up. Dr. Cena Benton was called and he ordered head CT Scan , Patient was able to answer all questions  No neuro deficits noted Safety Zone to follow

## 2015-12-07 NOTE — Progress Notes (Signed)
PROGRESS NOTE    Austin [name]  NTZ:[id number] DOB: 1970-05-14 DOA: 12/04/2015 PCP: Austin Puffer, MD   Brief Narrative:  Pt is a 45 y/o male with alcohol abuse and previous history of atrial fibrillation presents to the ER because of increasing lower extremity edema over the last couple weeks  Assessment & Plan:   Principal Problem:   Anasarca - Lasix discontinue secondary to worsening renal function suspected low intravascular volume and hypotension.  Hypotension - Critical care team consulted and on board. - Has complicated treating patient's Afib with RVR  Active Problems:   Atrial fibrillation with RVR [facility]) - cardiology on board and managing - B blocker being added today.    Alcoholic hepatitis - Most likely causing principle problem    ARF (acute renal failure) (HCC) - Currently stable, will avoid nephrotoxic agents   DVT prophylaxis: auto anticoagulated last inr 1.9 Code Status: Full Family Communication: d/c patient directly Disposition Plan: pending hospital course.    Consultants:   Critical care  Cardiology   Procedures: None   Antimicrobials: levaquin   Subjective: Patient has no new complaints today. No acute issues reported overnight.  Objective: Vitals:   12/07/15 0400 12/07/15 0500 12/07/15 0600 12/07/15 0728  BP:  100/75 (!) 82/65 (!) 89/78  Pulse: 76 64 (!) 55 (!) 112  Resp: (!) 30 16 19 20   Temp:    97 F (36.1 C)  TempSrc:    Oral  SpO2: 98% 100% 98% 97%  Weight:      Height:        Intake/Output Summary (Last 24 hours) at 12/07/15 1025 Last data filed at 12/06/15 2000  Gross per 24 hour  Intake              600 ml  Output              250 ml  Net              350 ml   Filed Weights   12/05/15 0545 12/06/15 0525 12/07/15 0300  Weight: 92.3 kg (203 lb 7.8 oz) 92 kg (202 lb 13.2 oz) 92.8 kg (204 lb 9.4 oz)    Examination:  General exam: Appears calm and comfortable  Respiratory system: Clear to auscultation.  Respiratory effort normal. Cardiovascular system: S1 & S2 heard, RRR. No JVD, murmurs, rubs, gallops or clicks. No pedal edema. Gastrointestinal system: Abdomen is + distended, soft and nontender. No organomegaly or masses felt. Normal bowel sounds heard. Central nervous system: Awake and alert, no facial asymmetry, answers questions appropriately  Extremities: Symmetric 5 x 5 power. Skin: No rashes, lesions or ulcers Psychiatry: Judgement and insight appear normal. Mood & affect appropriate.   Data Reviewed: I have personally reviewed following labs and imaging studies  CBC:  Recent Labs Lab 12/04/15 1355 12/05/15 0718 12/06/15 1050  WBC 10.3 9.0 8.4  NEUTROABS  --   --  6.4  HGB 14.6 14.5 14.6  HCT 41.2 40.5 40.7  MCV 113.2* 110.7* 112.7*  PLT 134* 121* 107*   Basic Metabolic Panel:  Recent Labs Lab 12/04/15 1355 12/04/15 2337 12/05/15 0718 12/06/15 1050  NA 125*  --  126*  125* 125*  K 3.5  --  3.8  3.9 3.5  CL 78*  --  78*  78* 80*  CO2 24  --  33*  32 31  GLUCOSE 116*  --  104*  104* 126*  BUN 49*  --  50*  51* 56*  CREATININE  2.61*  --  2.64*  2.58* 2.60*  CALCIUM 8.5*  --  8.7*  8.7* 8.5*  MG  --  1.7 1.7  --   PHOS  --   --  4.2  --    GFR: Estimated Creatinine Clearance: 42 mL/min (by C-G formula based on SCr of 2.6 mg/dL). Liver Function Tests:  Recent Labs Lab 12/04/15 1355 12/05/15 0718 12/06/15 1050  AST 57* 51* 48*  ALT 27 24 19   ALKPHOS 99 98 91  BILITOT 12.2* 9.7* 8.6*  PROT 6.5 5.9* 5.6*  ALBUMIN 3.0* 2.8* 2.7*    Recent Labs Lab 12/04/15 1355  LIPASE 18   No results for input(s): AMMONIA in the last 168 hours. Coagulation Profile:  Recent Labs Lab 12/04/15 2137  INR 1.99   Cardiac Enzymes:  Recent Labs Lab 12/04/15 2337 12/05/15 0718 12/05/15 1026  TROPONINI 0.16* 0.14* 0.18*   BNP (last 3 results) No results for input(s): PROBNP in the last 8760 hours. HbA1C: No results for input(s): HGBA1C in the last 72  hours. CBG: No results for input(s): GLUCAP in the last 168 hours. Lipid Profile: No results for input(s): CHOL, HDL, LDLCALC, TRIG, CHOLHDL, LDLDIRECT in the last 72 hours. Thyroid Function Tests:  Recent Labs  12/05/15 0012  TSH 1.724   Anemia Panel: No results for input(s): VITAMINB12, FOLATE, FERRITIN, TIBC, IRON, RETICCTPCT in the last 72 hours. Sepsis Labs:  Recent Labs Lab 12/05/15 0718 12/05/15 0943 12/07/15 0707  PROCALCITON 1.48  --  0.89  LATICACIDVEN 3.7* 3.4*  --     Recent Results (from the past 240 hour(s))  Culture, blood (Routine X 2) w Reflex to ID Panel     Status: None (Preliminary result)   Collection Time: 12/04/15  9:32 PM  Result Value Ref Range Status   Specimen Description BLOOD LEFT ANTECUBITAL  Final   Special Requests BOTTLES DRAWN AEROBIC AND ANAEROBIC 5CC  Final   Culture NO GROWTH 2 DAYS  Final   Report Status PENDING  Incomplete  Culture, blood (Routine X 2) w Reflex to ID Panel     Status: None (Preliminary result)   Collection Time: 12/04/15  9:34 PM  Result Value Ref Range Status   Specimen Description BLOOD RIGHT HAND  Final   Special Requests IN PEDIATRIC BOTTLE 2CC  Final   Culture NO GROWTH 2 DAYS  Final   Report Status PENDING  Incomplete  Culture, Urine     Status: None   Collection Time: 12/05/15  6:58 AM  Result Value Ref Range Status   Specimen Description URINE, RANDOM  Final   Special Requests NONE  Final   Culture NO GROWTH  Final   Report Status 12/06/2015 FINAL  Final  MRSA PCR Screening     Status: None   Collection Time: 12/05/15 11:38 AM  Result Value Ref Range Status   MRSA by PCR NEGATIVE NEGATIVE Final    Comment:        The GeneXpert MRSA Assay (FDA approved for NASAL specimens only), is one component of a comprehensive MRSA colonization surveillance program. It is not intended to diagnose MRSA infection nor to guide or monitor treatment for MRSA infections.          Radiology Studies: US  Abdomen Complete  Result Date: 12/05/2015 CLINICAL DATA:  Patient with elevated LFTs. Anasarca. Alcohol abuse. EXAM: ABDOMEN ULTRASOUND COMPLETE COMPARISON:  CT abdomen pelvis 12/04/2015 FINDINGS: Gallbladder: Sludge within the gallbladder lumen. No gallbladder wall thickening or pericholecystic fluid. Negative  sonographic Murphy's sign. Common bile duct: Diameter: 5 mm Liver: Diffusely increased in echogenicity. No focal lesion identified. IVC: No abnormality visualized. Pancreas: Visualized portion unremarkable. Spleen: Size and appearance within normal limits. Right Kidney: Length: 12.0 cm. Echogenicity within normal limits. No mass or hydronephrosis visualized. Left Kidney: Length: 12.4 cm. Echogenicity within normal limits. No mass or hydronephrosis visualized. Abdominal aorta: No aneurysm visualized. Other findings: Diffuse ascites.  Bilateral pleural effusions. IMPRESSION: Gallbladder sludge. No secondary signs to suggest acute cholecystitis. Hepatic steatosis. Ascites. Bilateral pleural effusions. Electronically Signed   By: Annia Belt M.D.   On: 12/05/2015 17:16        Scheduled Meds: . amiodarone  400 mg Oral BID  . folic acid  1 mg Oral Daily  . levofloxacin  750 mg Oral Q48H  . LORazepam  0-4 mg Oral Q12H  . metoprolol tartrate  12.5 mg Oral BID  . multivitamin with minerals  1 tablet Oral Daily  . protein supplement  2 oz Oral QID  . thiamine  100 mg Oral Daily   Or  . thiamine  100 mg Intravenous Daily   Continuous Infusions:    LOS: 3 days    Time spent: > 35 minutes  Penny Pia, MD [facility] Pager [phone]  If 7PM-7AM, please contact night-coverage [email or web address] Password [email or web address] 12/07/2015, 10:25 AM

## 2015-12-07 NOTE — Progress Notes (Signed)
PULMONARY / CRITICAL CARE MEDICINE   Name: Austin [name] MRN: [id number] DOB: Oct 25, 1970    ADMISSION DATE:  12/04/2015 CONSULTATION DATE: June Leap  REFERRING MD: [facility]  CHIEF COMPLAINT: Abd tenderness  HISTORY OF PRESENT ILLNESS:   45 yo male , smoker, heavy drinker, non compliant with Afib medication(coumadin) due to loss of job. Presents 8/1 with CC: lower ext edema and abdominal discomfort. CT abd revealed anasarca , hepatomegaly, and he was given lasix fro lower ext edema. 8/2 early am he developed afib with RVR 150's and hypotension 80/50 and was given lasix with no response. PCCM called to bedside for hypotension.   PAST MEDICAL HISTORY :  He  has a past medical history of Alcoholic hepatitis; Anasarca (12/2015); ARF (acute renal failure) (HCC) (12/2015); and Atrial fibrillation (HCC).  PAST SURGICAL HISTORY: He  has a past surgical history that includes arm surgery.  Allergies  Allergen Reactions  . Penicillins     Has patient had a PCN reaction causing immediate rash, facial/tongue/throat swelling, SOB or lightheadedness with hypotension: YES Has patient had a PCN reaction causing severe rash involving mucus membranes or skin necrosis: NO Has patient had a PCN reaction that required hospitalization NO Has patient had a PCN reaction occurring within the last 10 years: NO If all of the above answers are "NO", then may proceed with Cephalosporin use.    No current facility-administered medications on file prior to encounter.    No current outpatient prescriptions on file prior to encounter.    FAMILY HISTORY:  His indicated that the status of his paternal uncle is unknown.    SOCIAL HISTORY: He  reports that he has been smoking Cigarettes.  He has a 25.00 pack-year smoking history. He has never used smokeless tobacco. He reports that he drinks about 50.4 oz of alcohol per week . He reports that he does not use drugs.  REVIEW OF SYSTEMS:   10 point review of system taken,  please see HPI for positives and negatives.  SUBJECTIVE:  Feels well with no complaints. BP is better today.  VITAL SIGNS: BP (!) 89/78 (BP Location: Left Arm)   Pulse (!) 112   Temp 97 F (36.1 C) (Oral)   Resp 20   Ht 6\' 3"  (1.905 m)   Wt 204 lb 9.4 oz (92.8 kg)   SpO2 97%   BMI 25.57 kg/m   HEMODYNAMICS:    VENTILATOR SETTINGS:    INTAKE / OUTPUT: I/O last 3 completed shifts: In: 720 [P.O.:720] Out: 700 [Urine:700]  PHYSICAL EXAMINATION: General:  Appears older than age.  Neuro:  No focal deficits HEENT:  +JVD, No lymphadenopathy Cardiovascular:  Irregular, no MRG Lungs:Clear, No wheeze or crackles Abdomen:  Distended , tender diffusely , increased ruq tenderness, + fluid wave with ascites Musculoskeletal: intact Skin:  3+ lower ext pitting edema, No rash  LABS:  BMET  Recent Labs Lab 12/04/15 1355 12/05/15 0718 12/06/15 1050  NA 125* 126*  125* 125*  K 3.5 3.8  3.9 3.5  CL 78* 78*  78* 80*  CO2 24 33*  32 31  BUN 49* 50*  51* 56*  CREATININE 2.61* 2.64*  2.58* 2.60*  GLUCOSE 116* 104*  104* 126*    Electrolytes  Recent Labs Lab 12/04/15 1355 12/04/15 2337 12/05/15 0718 12/06/15 1050  CALCIUM 8.5*  --  8.7*  8.7* 8.5*  MG  --  1.7 1.7  --   PHOS  --   --  4.2  --  CBC  Recent Labs Lab 12/04/15 1355 12/05/15 0718 12/06/15 1050  WBC 10.3 9.0 8.4  HGB 14.6 14.5 14.6  HCT 41.2 40.5 40.7  PLT 134* 121* 107*    Coag's  Recent Labs Lab 12/04/15 2137  INR 1.99    Sepsis Markers  Recent Labs Lab 12/05/15 0718 12/05/15 0943 12/07/15 0707  LATICACIDVEN 3.7* 3.4*  --   PROCALCITON 1.48  --  0.89    ABG No results for input(s): PHART, PCO2ART, PO2ART in the last 168 hours.  Liver Enzymes  Recent Labs Lab 12/04/15 1355 12/05/15 0718 12/06/15 1050  AST 57* 51* 48*  ALT ALKPHOS 99 98 91  BILITOT 12.2* 9.7* 8.6*  ALBUMIN 3.0* 2.8* 2.7*    Cardiac Enzymes  Recent Labs Lab 12/04/15 2337  12/05/15 0718 12/05/15 1026  TROPONINI 0.16* 0.14* 0.18*    Glucose No results for input(s): GLUCAP in the last 168 hours.  Imaging No results found.   STUDIES:  8/2 LEDS>> 8/2 Echo 8/2 >> Reduced LVEF 35-40%, diffuse hypokinesis. PAP 45mm hg  CULTURES:   ANTIBIOTICS: 8/1 levaquin>>  SIGNIFICANT EVENTS:   LINES/TUBES:   DISCUSSION: 45 yo male , smoker, heavy drinker, cirrhosis, non compliant with Afib medication(coumadin) due to loss of job. Presents 8/1 with CC: lower ext edema and abdominal discomfort. CT abd revealed anasarca , hepatomegaly.  Transferred to SDU for hypotension, rapid a fib.  Shock is likely from volume depletion, a fib. There is no evidence of sepsis. BP has improved. Cardiology is following for a fib.  ASSESSMENT / PLAN:  PULMONARY A: No acute issue P:   O2 as needed  CARDIOVASCULAR A:  PAF with AF RVR, non compliant with medications Suspected intravascular dry Hypotension P:  Albumin as needed for low BP  RENAL A:   New onset of renal failure Suspect intervascular dry P:   Lasix on hold as Cr is higher  GASTROINTESTINAL A:   Ascites from liver dysfunction GI protection P:   PPI  HEMATOLOGIC A:   Non compliant with coumadin for PAF P:  May need anticoagulation  INFECTIOUS A:   Elevated lactic acid 5.2 on admit with WBC 9.0 and non febrile. On Levaquin per [facility] service. P:   Pct is improving. Can consider paracentesis but high risk with elevated INR. Yield will likely be low as he is already on antibiotics.  ENDOCRINE A:   No acute issues   P:   Follow glucose   NEUROLOGIC A:   Intact, RASS 1 Follows commands and mae x 4 P:   RASS goal:1 Limit sedation  FAMILY  - Updates: Patient updated at bedside. - Inter-disciplinary family meet or Palliative Care meeting due by:  8/9  Chilton Greathouse MD [facility] Pager [phone] If no answer or after 3pm call: [phone] 12/07/2015,  11:17 AM

## 2015-12-07 NOTE — Progress Notes (Signed)
Patient Name: Austin [name] Date of Encounter: 12/07/2015  45 yo [place] man with ongoing alcohol abuse (until 2 days ago) and history of AF treated with warfarin and Dilt four years ago, presents today with worsening swelling in his legs for the past couple weeks    SUBJECTIVE  Feels weak. No chest pain or sob.   Echo shows moderate LV dysfunction - EF 35-40% Moderate PHTN Moderate - severe MR  No complaints BP is better this afternoon   CURRENT MEDS . amiodarone  400 mg Oral BID  . folic acid  1 mg Oral Daily  . levofloxacin  750 mg Oral Q48H  . LORazepam  0-4 mg Oral Q12H  . multivitamin with minerals  1 tablet Oral Daily  . protein supplement  2 oz Oral QID  . thiamine  100 mg Oral Daily   Or  . thiamine  100 mg Intravenous Daily    OBJECTIVE  Vitals:   12/07/15 0400 12/07/15 0500 12/07/15 0600 12/07/15 0728  BP:  100/75 (!) 82/65 (!) 89/78  Pulse: 76 64 (!) 55 (!) 112  Resp: (!) Temp:    97 F (36.1 C)  TempSrc:    Oral  SpO2: 98% 100% 98% 97%  Weight:      Height:        Intake/Output Summary (Last 24 hours) at 12/07/15 0949 Last data filed at 12/06/15 2000  Gross per 24 hour  Intake              600 ml  Output              250 ml  Net              350 ml   Filed Weights   12/05/15 0545 12/06/15 0525 12/07/15 0300  Weight: 203 lb 7.8 oz (92.3 kg) 202 lb 13.2 oz (92 kg) 204 lb 9.4 oz (92.8 kg)    PHYSICAL EXAM Current BP is 103/81    HR is 123  General: ill appearing male in NAD.   Skin is sallow / yellow in color   Neuro: Alert and oriented X 3. Moves all extremities spontaneously. Psych: Normal affect. HEENT:  Normal  Neck: Supple without bruits. + JVD. Lungs:  Bilateral wheezes  Heart: Ir Ir tachycardic no s3, s4,  2-3/6 systolic murmur radiating to the axilla and ULSB.   Abdomen: Soft, non-tender, non-distended, BS + x 4.  Extremities: No clubbing, cyanosis. 3+ - 4+  BL LE  Edema upto thighs. DP/PT/Radials 2+ and equal  bilaterally.  Accessory Clinical Findings  CBC  Recent Labs  12/05/15 0718 12/06/15 1050  WBC 9.0 8.4  NEUTROABS  --  6.4  HGB 14.5 14.6  HCT 40.5 40.7  MCV 110.7* 112.7*  PLT 121* 107*   Basic Metabolic Panel  Recent Labs  12/04/15 2337 12/05/15 0718 12/06/15 1050  NA  --  126*  125* 125*  K  --  3.8  3.9 3.5  CL  --  78*  78* 80*  CO2  --  33*  32 31  GLUCOSE  --  104*  104* 126*  BUN  --  50*  51* 56*  CREATININE  --  2.64*  2.58* 2.60*  CALCIUM  --  8.7*  8.7* 8.5*  MG 1.7 1.7  --   PHOS  --  4.2  --    Liver Function Tests  Recent Labs  12/05/15 0718 12/06/15 1050  AST 51* 48*  ALT 24 19  ALKPHOS 98 91  BILITOT 9.7* 8.6*  PROT 5.9* 5.6*  ALBUMIN 2.8* 2.7*    Recent Labs  12/04/15 1355  LIPASE 18   Cardiac Enzymes  Recent Labs  12/04/15 2337 12/05/15 0718 12/05/15 1026  TROPONINI 0.16* 0.14* 0.18*   BNP Invalid input(s): POCBNP D-Dimer No results for input(s): DDIMER in the last 72 hours. Hemoglobin A1C No results for input(s): HGBA1C in the last 72 hours. Fasting Lipid Panel No results for input(s): CHOL, HDL, LDLCALC, TRIG, CHOLHDL, LDLDIRECT in the last 72 hours. Thyroid Function Tests  Recent Labs  12/05/15 0012  TSH 1.724    TELE  afib at rate of 110-120s  Radiology/Studies  Ct Abdomen Pelvis Wo Contrast  Result Date: 12/04/2015 CLINICAL DATA:  45 year old male with increased lower extremity swelling and abdominal pain. Jaundice. Initial encounter. EXAM: CT ABDOMEN AND PELVIS WITHOUT CONTRAST TECHNIQUE: Multidetector CT imaging of the abdomen and pelvis was performed following the standard protocol without IV contrast. COMPARISON:  CT chest abdomen and pelvis 05/05/2010. FINDINGS: New small to moderate bilateral layering pleural effusions with compressive lower lobe atelectasis. Cardiomegaly appears new since 2012. No pericardial effusion. No acute osseous abnormality identified. Anasarca, with diffuse abdominal  wall and dependent flank subcutaneous stranding. Moderate volume simple appearing free fluid in the pelvis. Small volume free fluid throughout the abdomen, including perihepatic and perisplenic fluid. Negative rectum and urinary bladder. Occasional sigmoid diverticula with no active inflammation. Decompressed left colon. Mild wall thickening of the transverse colon which might be reactive. More normal appearance of the right colon. Negative appendix. Decompressed terminal ileum. Oral contrast in the small bowel, but has not yet reached the TI. Small volume of oral contrast in the distal esophagus. Negative stomach. Decompressed duodenum. Mild hepatomegaly which is new since 2012. No discrete liver lesion identified in the absence of contrast. No intrahepatic or extrahepatic biliary ductal dilatation is evident on this noncontrast exam. Increased density within the gallbladder might be sludge. The main portal vein does not appear enlarged. Negative noncontrast spleen, no splenomegaly. Negative noncontrast pancreas and adrenal glands. Negative noncontrast kidneys. Aortoiliac calcified atherosclerosis noted. No lymphadenopathy identified. IMPRESSION: 1. Anasarca with abdominal and pelvic ascites and layering pleural effusions (small to moderate volume). 2. Cardiomegaly and hepatomegaly are new since 2012. No discrete liver lesion in the absence of contrast. No findings to suggest chronic portal hypertension. The top differential considerations in this clinical setting include heart failure with congestive hepatopathy versus acute or or chronic hepatitis. 3. Calcified aortic atherosclerosis. 4. Suggestion of gallbladder sludge. Electronically Signed   By: Odessa Fleming M.D.   On: 12/04/2015 20:49   US Abdomen Complete  Result Date: 12/05/2015 CLINICAL DATA:  Patient with elevated LFTs. Anasarca. Alcohol abuse. EXAM: ABDOMEN ULTRASOUND COMPLETE COMPARISON:  CT abdomen pelvis 12/04/2015 FINDINGS: Gallbladder: Sludge within  the gallbladder lumen. No gallbladder wall thickening or pericholecystic fluid. Negative sonographic Murphy's sign. Common bile duct: Diameter: 5 mm Liver: Diffusely increased in echogenicity. No focal lesion identified. IVC: No abnormality visualized. Pancreas: Visualized portion unremarkable. Spleen: Size and appearance within normal limits. Right Kidney: Length: 12.0 cm. Echogenicity within normal limits. No mass or hydronephrosis visualized. Left Kidney: Length: 12.4 cm. Echogenicity within normal limits. No mass or hydronephrosis visualized. Abdominal aorta: No aneurysm visualized. Other findings: Diffuse ascites.  Bilateral pleural effusions. IMPRESSION: Gallbladder sludge. No secondary signs to suggest acute cholecystitis. Hepatic steatosis. Ascites. Bilateral pleural effusions. Electronically Signed   By: Francis Gaines.D.  On: 12/05/2015 17:16   Dg Chest Port 1 View  Result Date: 12/05/2015 CLINICAL DATA:  45 year old male with cough and anasarca EXAM: PORTABLE CHEST 1 VIEW COMPARISON:  CT dated 05/05/2010 FINDINGS: There is mild cardiomegaly with increased vascular and interstitial prominence compatible with congestive changes and mild interstitial edema. Superimposed pneumonia is not excluded. No focal consolidation, pleural effusion, or pneumothorax. No acute osseous pathology. IMPRESSION: Mild cardiomegaly and congestive changes.  No focal consolidation. Electronically Signed   By: Elgie Collard M.D.   On: 12/05/2015 00:39    ASSESSMENT AND PLAN   1. Afib with RVR - Hx of PAF. Self discontinued warfarin about 2 years ago due to lack of insurance as he lost his job.  HR has not really slowed yet on the amiodarone BP is up some,  Will add metoprolol 12. 5 PO bid    And follow   2. Anasarca - Given IV lasix 40mg  x 1.  No siginifcant diuresis at this point Possibly scheduled for paracentesis later today according to patient .    3. Chronic systolic CHF:   Likely multifactorial - hx of  heavy ETOH abuse, long standing rapid atrial fib, ? CAD with long standing hx of cigarette smoking   4. Chronic renal insufficiency :  Creatinine is 2.6 Plans per PCCM and IM   5. Hepatic failure :    Kristeen Miss, MD  12/07/2015 9:49 AM    [facility] [address] [place],  [address] [place], [place]  [address] Pager [phone] Phone: [phone]; Fax: [phone]

## 2015-12-07 NOTE — Progress Notes (Signed)
Patient calling out c/o not being  Able to breath noted more wheezes none earlier and MD was called RT in to check patient and Breathing tx ordered sats are 90-100% now

## 2015-12-07 NOTE — Progress Notes (Signed)
Pharmacy Antibiotic Note  JADAN CUBBAGE is a 45 y.o. male admitted on 12/04/2015 with lower extremity edema/abdominal pain.  Pharmacy has been consulted for Levaquin dosing for possible intra-abdominal infection. Afebrile, WBC WNL. Antibiotics adjusted for renal dysfunction.   Plan: -Levaquin 750 mg PO q48h  -Trend WBC, SCr, temperature  -F/U infectious work-up  Height: 6\' 3"  (190.5 cm) Weight: 204 lb 9.4 oz (92.8 kg) IBW/kg (Calculated) : 84.5  Temp (24hrs), Avg:97.5 F (36.4 C), Min:97 F (36.1 C), Max:98 F (36.7 C)   Recent Labs Lab 12/04/15 1355 12/05/15 0718 12/05/15 0943 12/06/15 1050  WBC 10.3 9.0  --  8.4  CREATININE 2.61* 2.64*  2.58*  --  2.60*  LATICACIDVEN  --  3.7* 3.4*  --     Estimated Creatinine Clearance: 42 mL/min (by C-G formula based on SCr of 2.6 mg/dL).    Allergies  Allergen Reactions  . Penicillins     Has patient had a PCN reaction causing immediate rash, facial/tongue/throat swelling, SOB or lightheadedness with hypotension: YES Has patient had a PCN reaction causing severe rash involving mucus membranes or skin necrosis: NO Has patient had a PCN reaction that required hospitalization NO Has patient had a PCN reaction occurring within the last 10 years: NO If all of the above answers are "NO", then may proceed with Cephalosporin use.    York Cerise, PharmD Pharmacy Resident  Pager [phone] 12/07/15 11:21 AM

## 2015-12-07 NOTE — Progress Notes (Signed)
Patient insisted on removal of foley because of penile pain. Hand was washed and sterile glove don for removal of foley. Urinal provided to patient after foley was removed.

## 2015-12-08 ENCOUNTER — Inpatient Hospital Stay (HOSPITAL_COMMUNITY): Payer: Medicaid Other

## 2015-12-08 DIAGNOSIS — R570 Cardiogenic shock: Secondary | ICD-10-CM

## 2015-12-08 LAB — CARBOXYHEMOGLOBIN
CARBOXYHEMOGLOBIN: 1 % (ref 0.5–1.5)
Carboxyhemoglobin: 1 % (ref 0.5–1.5)
METHEMOGLOBIN: 0.7 % (ref 0.0–1.5)
Methemoglobin: 0.9 % (ref 0.0–1.5)
O2 SAT: 37.9 %
O2 Saturation: 38.6 %
Total hemoglobin: 14.2 g/dL (ref 13.5–18.0)
Total hemoglobin: 14.6 g/dL (ref 13.5–18.0)

## 2015-12-08 LAB — LACTIC ACID, PLASMA: Lactic Acid, Venous: 3.8 mmol/L (ref 0.5–1.9)

## 2015-12-08 LAB — RENAL FUNCTION PANEL
ALBUMIN: 2.8 g/dL — AB (ref 3.5–5.0)
ANION GAP: 20 — AB (ref 5–15)
BUN: 67 mg/dL — AB (ref 6–20)
CALCIUM: 9 mg/dL (ref 8.9–10.3)
CO2: 27 mmol/L (ref 22–32)
Chloride: 78 mmol/L — ABNORMAL LOW (ref 101–111)
Creatinine, Ser: 3.14 mg/dL — ABNORMAL HIGH (ref 0.61–1.24)
GFR calc Af Amer: 26 mL/min — ABNORMAL LOW (ref >60)
GFR calc non Af Amer: 22 mL/min — ABNORMAL LOW (ref >60)
GLUCOSE: 104 mg/dL — AB (ref 65–99)
POTASSIUM: 3.6 mmol/L (ref 3.5–5.1)
Phosphorus: 5.1 mg/dL — ABNORMAL HIGH (ref 2.5–4.6)
SODIUM: 125 mmol/L — AB (ref 135–145)

## 2015-12-08 LAB — CBC WITH DIFFERENTIAL/PLATELET
Basophils Absolute: 0 10*3/uL (ref 0.0–0.1)
Basophils Relative: 0 %
EOS PCT: 1 %
Eosinophils Absolute: 0.1 10*3/uL (ref 0.0–0.7)
HEMATOCRIT: 43.3 % (ref 39.0–52.0)
Hemoglobin: 15.7 g/dL (ref 13.0–17.0)
LYMPHS PCT: 19 %
Lymphs Abs: 1.7 10*3/uL (ref 0.7–4.0)
MCH: 40.5 pg — AB (ref 26.0–34.0)
MCHC: 36.3 g/dL — AB (ref 30.0–36.0)
MCV: 111.6 fL — AB (ref 78.0–100.0)
MONO ABS: 0.6 10*3/uL (ref 0.1–1.0)
MONOS PCT: 7 %
NEUTROS ABS: 6.5 10*3/uL (ref 1.7–7.7)
Neutrophils Relative %: 73 %
PLATELETS: 86 10*3/uL — AB (ref 150–400)
RBC: 3.88 MIL/uL — ABNORMAL LOW (ref 4.22–5.81)
RDW: 16.2 % — AB (ref 11.5–15.5)
WBC: 8.9 10*3/uL (ref 4.0–10.5)

## 2015-12-08 LAB — PROTIME-INR
INR: 2.16
Prothrombin Time: 24.4 seconds — ABNORMAL HIGH (ref 11.4–15.2)

## 2015-12-08 LAB — MAGNESIUM: Magnesium: 1.9 mg/dL (ref 1.7–2.4)

## 2015-12-08 MED ORDER — POTASSIUM CHLORIDE CRYS ER 20 MEQ PO TBCR
20.0000 meq | EXTENDED_RELEASE_TABLET | Freq: Once | ORAL | Status: AC
  Administered 2015-12-08: 20 meq via ORAL
  Filled 2015-12-08: qty 1

## 2015-12-08 MED ORDER — SODIUM CHLORIDE 0.9% FLUSH
10.0000 mL | Freq: Two times a day (BID) | INTRAVENOUS | Status: DC
  Administered 2015-12-09 – 2015-12-17 (×14): 10 mL

## 2015-12-08 MED ORDER — AMIODARONE HCL 200 MG PO TABS
200.0000 mg | ORAL_TABLET | Freq: Two times a day (BID) | ORAL | Status: DC
  Administered 2015-12-08 – 2015-12-09 (×2): 200 mg via ORAL
  Filled 2015-12-08 (×2): qty 1

## 2015-12-08 MED ORDER — SODIUM CHLORIDE 0.9% FLUSH: 10.0000 mL | INTRAVENOUS | Status: DC | PRN

## 2015-12-08 MED ORDER — METOLAZONE 2.5 MG PO TABS
2.5000 mg | ORAL_TABLET | Freq: Once | ORAL | Status: AC
  Administered 2015-12-08: 2.5 mg via ORAL
  Filled 2015-12-08: qty 1

## 2015-12-08 MED ORDER — FUROSEMIDE 10 MG/ML IJ SOLN
80.0000 mg | Freq: Two times a day (BID) | INTRAMUSCULAR | Status: DC
  Administered 2015-12-08 – 2015-12-09 (×2): 80 mg via INTRAVENOUS
  Filled 2015-12-08 (×2): qty 8

## 2015-12-08 MED ORDER — LEVALBUTEROL HCL 0.63 MG/3ML IN NEBU
0.6300 mg | INHALATION_SOLUTION | Freq: Four times a day (QID) | RESPIRATORY_TRACT | Status: DC | PRN
Start: 2015-12-08 — End: 2015-12-17
  Administered 2015-12-08: 0.63 mg via RESPIRATORY_TRACT
  Filled 2015-12-08: qty 3

## 2015-12-08 MED ORDER — DOBUTAMINE IN D5W 4-5 MG/ML-% IV SOLN
4.0000 ug/kg/min | INTRAVENOUS | Status: DC
  Administered 2015-12-08: 2.5 ug/kg/min via INTRAVENOUS
  Filled 2015-12-08: qty 250

## 2015-12-08 NOTE — Progress Notes (Signed)
Notified Dr. Jimmy Footman of pts c/o inability to breath, pulse oximeter not reading accurately due to pts cold extremities, neb x1 given for wheezing/SOB, repositioning and personal needs done/met with no resolution of pts complaints. Orders received

## 2015-12-08 NOTE — Progress Notes (Signed)
Dr. Cena Benton called and spoke with Mother of patient and gave her an update. She states she does not have any questions at this time. I have spoken with mother and told her if she does have questions to please ask and if I am unable to find the answer we will contact the physician again.

## 2015-12-08 NOTE — Progress Notes (Signed)
PROGRESS NOTE    Austin [name]  ZOX:[id number] DOB: [birth year] DOA: 12/04/2015 PCP: Aida Puffer, MD   Brief Narrative:  Pt is a 45 y/o male with alcohol abuse and previous history of atrial fibrillation presents to the ER because of increasing lower extremity edema over the last couple weeks.  Hospital stay complicated by hypotension and A. fib with RVR  Assessment & Plan:   Principal Problem:   Anasarca - secondary to worsening renal function suspected low intravascular volume and hypotension.  Hypotension - Critical care team consulted and on board. - Complicates treating patient's Afib with RVR  Active Problems:   Atrial fibrillation with RVR [facility]) - cardiology on board and managing - B blocker being added today.    Alcoholic hepatitis - Most likely causing principle problem    ARF (acute renal failure) (HCC) - Currently stable, will avoid nephrotoxic agents   DVT prophylaxis: auto anticoagulated last inr 1.9 Code Status: Full Family Communication: d/c patient directly Disposition Plan: May require palliative care if patient shows no improvement in general condition   Consultants:   Critical care  Cardiology   Procedures: None   Antimicrobials: levaquin   Subjective: Pt has no new complaints.   Objective: Vitals:   12/08/15 0900 12/08/15 1000 12/08/15 1100 12/08/15 1120  BP: (!) 56/46 (!) 82/68 (!) 74/48   Pulse: 98 (!) 102 91   Resp: (!) 25 10 (!) 0   Temp:    97.6 F (36.4 C)  TempSrc:    Axillary  SpO2: 91% 95% (!) 89%   Weight:      Height:        Intake/Output Summary (Last 24 hours) at 12/08/15 1519 Last data filed at 12/08/15 1404  Gross per 24 hour  Intake              310 ml  Output              200 ml  Net              110 ml   Filed Weights   12/05/15 0545 12/06/15 0525 12/07/15 0300  Weight: 92.3 kg (203 lb 7.8 oz) 92 kg (202 lb 13.2 oz) 92.8 kg (204 lb 9.4 oz)    Examination:  General exam: Appears calm and  comfortable  Respiratory system: Clear to auscultation. Respiratory effort normal. Cardiovascular system: S1 & S2 heard, RRR. No JVD, murmurs, rubs, gallops or clicks. No pedal edema. Gastrointestinal system: Abdomen is + distended, soft and nontender. No organomegaly or masses felt. Normal bowel sounds heard. Central nervous system: Awake and alert, no facial asymmetry, answers questions appropriately  Extremities: Symmetric 5 x 5 power. Skin: No rashes, lesions or ulcers Psychiatry: Judgement and insight appear normal. Mood & affect appropriate.   Data Reviewed: I have personally reviewed following labs and imaging studies  CBC:  Recent Labs Lab 12/04/15 1355 12/05/15 0718 12/06/15 1050 12/08/15 0758  WBC 10.3 9.0 8.4 8.9  NEUTROABS  --   --  6.4 6.5  HGB 14.6 14.5 14.6 15.7  HCT 41.2 40.5 40.7 43.3  MCV 113.2* 110.7* 112.7* 111.6*  PLT 134* 121* 107* 86*   Basic Metabolic Panel:  Recent Labs Lab 12/04/15 1355 12/04/15 2337 12/05/15 0718 12/06/15 1050 12/08/15 0758  NA 125*  --  126*  125* 125* 125*  K 3.5  --  3.8  3.9 3.5 3.6  CL 78*  --  78*  78* 80* 78*  CO2 24  --  33*  32 31 27  GLUCOSE 116*  --  104*  104* 126* 104*  BUN 49*  --  50*  51* 56* 67*  CREATININE 2.61*  --  2.64*  2.58* 2.60* 3.14*  CALCIUM 8.5*  --  8.7*  8.7* 8.5* 9.0  MG  --  1.7 1.7  --  1.9  PHOS  --   --  4.2  --  5.1*   GFR: Estimated Creatinine Clearance: 34.8 mL/min (by C-G formula based on SCr of 3.14 mg/dL). Liver Function Tests:  Recent Labs Lab 12/04/15 1355 12/05/15 0718 12/06/15 1050 12/08/15 0758  AST 57* 51* 48*  --   ALT 27 24 19   --   ALKPHOS 99 98 91  --   BILITOT 12.2* 9.7* 8.6*  --   PROT 6.5 5.9* 5.6*  --   ALBUMIN 3.0* 2.8* 2.7* 2.8*    Recent Labs Lab 12/04/15 1355  LIPASE 18   No results for input(s): AMMONIA in the last 168 hours. Coagulation Profile:  Recent Labs Lab 12/04/15 2137 12/08/15 0758  INR 1.99 2.16   Cardiac  Enzymes:  Recent Labs Lab 12/04/15 2337 12/05/15 0718 12/05/15 1026  TROPONINI 0.16* 0.14* 0.18*   BNP (last 3 results) No results for input(s): PROBNP in the last 8760 hours. HbA1C: No results for input(s): HGBA1C in the last 72 hours. CBG: No results for input(s): GLUCAP in the last 168 hours. Lipid Profile: No results for input(s): CHOL, HDL, LDLCALC, TRIG, CHOLHDL, LDLDIRECT in the last 72 hours. Thyroid Function Tests: No results for input(s): TSH, T4TOTAL, FREET4, T3FREE, THYROIDAB in the last 72 hours. Anemia Panel: No results for input(s): VITAMINB12, FOLATE, FERRITIN, TIBC, IRON, RETICCTPCT in the last 72 hours. Sepsis Labs:  Recent Labs Lab 12/05/15 0718 12/05/15 0943 12/07/15 0707 12/08/15 0758  PROCALCITON 1.48  --  0.89  --   LATICACIDVEN 3.7* 3.4*  --  3.8*    Recent Results (from the past 240 hour(s))  Culture, blood (Routine X 2) w Reflex to ID Panel     Status: None (Preliminary result)   Collection Time: 12/04/15  9:32 PM  Result Value Ref Range Status   Specimen Description BLOOD LEFT ANTECUBITAL  Final   Special Requests BOTTLES DRAWN AEROBIC AND ANAEROBIC 5CC  Final   Culture NO GROWTH 3 DAYS  Final   Report Status PENDING  Incomplete  Culture, blood (Routine X 2) w Reflex to ID Panel     Status: None (Preliminary result)   Collection Time: 12/04/15  9:34 PM  Result Value Ref Range Status   Specimen Description BLOOD RIGHT HAND  Final   Special Requests IN PEDIATRIC BOTTLE 2CC  Final   Culture NO GROWTH 3 DAYS  Final   Report Status PENDING  Incomplete  Culture, Urine     Status: None   Collection Time: 12/05/15  6:58 AM  Result Value Ref Range Status   Specimen Description URINE, RANDOM  Final   Special Requests NONE  Final   Culture NO GROWTH  Final   Report Status 12/06/2015 FINAL  Final  MRSA PCR Screening     Status: None   Collection Time: 12/05/15 11:38 AM  Result Value Ref Range Status   MRSA by PCR NEGATIVE NEGATIVE Final     Comment:        The GeneXpert MRSA Assay (FDA approved for NASAL specimens only), is one component of a comprehensive MRSA colonization surveillance program. It is not intended to diagnose MRSA  infection nor to guide or monitor treatment for MRSA infections.          Radiology Studies: Ct Head Wo Contrast  Result Date: 12/07/2015 CLINICAL DATA:  Fall.  History of atrial fibrillation. EXAM: CT HEAD WITHOUT CONTRAST TECHNIQUE: Contiguous axial images were obtained from the base of the skull through the vertex without intravenous contrast. COMPARISON:  Head CT dated 05/05/2010. FINDINGS: Brain: There is generalized parenchymal atrophy, moderate in degree for age, with commensurate dilatation of the ventricles and sulci. There is no mass, hemorrhage, edema or other evidence of acute parenchymal abnormality. No extra-axial hemorrhage. Vascular: No hyperdense vessel or unexpected calcification. Skull: Negative for fracture or focal lesion. Sinuses/Orbits: No acute findings. Other: None. IMPRESSION: 1. No acute findings.  No intracranial mass, hemorrhage or edema. 2. Atrophy. Electronically Signed   By: Bary Richard M.D.   On: 12/07/2015 21:06        Scheduled Meds: . amiodarone  200 mg Oral BID  . folic acid  1 mg Oral Daily  . furosemide  80 mg Intravenous BID  . levofloxacin  750 mg Oral Q48H  . multivitamin with minerals  1 tablet Oral Daily  . protein supplement  2 oz Oral QID  . thiamine  100 mg Oral Daily   Or  . thiamine  100 mg Intravenous Daily   Continuous Infusions: . DOBUTamine 2.5 mcg/kg/min (12/08/15 1413)     LOS: 4 days    Time spent: > 35 minutes  Penny Pia, MD [facility] Pager [phone]  If 7PM-7AM, please contact night-coverage [email or web address] Password [email or web address] 12/08/2015, 3:19 PM

## 2015-12-08 NOTE — Progress Notes (Signed)
Peripherally Inserted Central Catheter/Midline Placement  The IV Nurse has discussed with the patient and/or persons authorized to consent for the patient, the purpose of this procedure and the potential benefits and risks involved with this procedure.  The benefits include less needle sticks, lab draws from the catheter, ability to perform PICC exchange if ordered by the physician and patient may be discharged home with the catheter.  Risks include, but not limited to, infection, bleeding, blood clot (thrombus formation), and puncture of an artery; nerve damage and irregular heat beat.  Alternatives to this procedure were also discussed.  Bard educational information left at bedside for the pt.  PICC/Midline Placement Documentation  PICC Double Lumen 12/08/15 PICC Right Brachial 38 cm 0 cm (Active)  Indication for Insertion or Continuance of Line Vasoactive infusions;Prolonged intravenous therapies;Chronic illness with exacerbations (CF, Sickle Cell, etc.) 12/08/2015  3:25 PM  Exposed Catheter (cm) 0 cm 12/08/2015  3:25 PM  Site Assessment Clean;Dry;Intact 12/08/2015  3:25 PM  Lumen #1 Status Flushed;Saline locked;Blood return noted 12/08/2015  3:25 PM  Lumen #2 Status Flushed;Saline locked;Blood return noted 12/08/2015  3:25 PM  Dressing Type Transparent 12/08/2015  3:25 PM  Dressing Status Clean;Dry;Intact;Antimicrobial disc in place 12/08/2015  3:25 PM  Line Care Connections checked and tightened 12/08/2015  3:25 PM  Line Adjustment (NICU/IV Team Only) No 12/08/2015  3:25 PM  Dressing Intervention New dressing 12/08/2015  3:25 PM  Dressing Change Due 12/15/15 12/08/2015  3:25 PM       Elliot Dally 12/08/2015, 3:26 PM

## 2015-12-08 NOTE — Progress Notes (Signed)
Patient refuses to wear SCDs.

## 2015-12-08 NOTE — Progress Notes (Signed)
Paged Dr. Cena Benton for family. They would like an update as well as a prognosis for patient. Spoke with Mr. Hinely and he has given permission for the physicians to speak with his family.

## 2015-12-08 NOTE — Progress Notes (Signed)
PICC in place, dobutamine infusing per order. Patient sleeping at this time. Patient can be very impulsive and will try to get up without assistance if not monitored very closely. Respiratory has been notified of need for set up for CVP monitoring.

## 2015-12-08 NOTE — Progress Notes (Addendum)
Patient Name: Austin [name] Date of Encounter: 12/08/2015  45 yo man with ongoing alcohol abuse, systolic HF and history of AF treated admitted with HF, renal failure and liver failure.   SUBJECTIVE  Says his breathing comes and goes. Taking oxygen off. Feels weak.   BP low. Renal function worse. Lactate 3.8   Echo shows moderate LV dysfunction - EF 35-40%. RV severely HK  Moderate PHTN Moderate - severe MR   CURRENT MEDS . amiodarone  400 mg Oral BID  . folic acid  1 mg Oral Daily  . levofloxacin  750 mg Oral Q48H  . metoprolol tartrate  12.5 mg Oral BID  . multivitamin with minerals  1 tablet Oral Daily  . protein supplement  2 oz Oral QID  . thiamine  100 mg Oral Daily   Or  . thiamine  100 mg Intravenous Daily    OBJECTIVE  Vitals:   12/08/15 0900 12/08/15 1000 12/08/15 1100 12/08/15 1120  BP: (!) 56/46 (!) 82/68 (!) 74/48   Pulse: 98 (!) 102 91   Resp: (!) 25 10 (!) 0   Temp:    97.6 F (36.4 C)  TempSrc:    Axillary  SpO2: 91% 95% (!) 89%   Weight:      Height:        Intake/Output Summary (Last 24 hours) at 12/08/15 1306 Last data filed at 12/08/15 0805  Gross per 24 hour  Intake              250 ml  Output              350 ml  Net             -100 ml   Filed Weights   12/05/15 0545 12/06/15 0525 12/07/15 0300  Weight: 92.3 kg (203 lb 7.8 oz) 92 kg (202 lb 13.2 oz) 92.8 kg (204 lb 9.4 oz)    PHYSICAL EXAM Current BP is 103/81    HR is 123  General: ill appearing male in NAD.   Skin is sallow / yellow in color   Neuro: Alert and oriented X 3. Moves all extremities spontaneously. Psych: Normal affect. HEENT:  scleral icterus.Temporal wasting  Neck: Supple without bruits. JVP to jaw  Lungs:  Bilateral wheezes  Heart: PMI laterally displaced irrg  +s3 3/6 systolic murmur radiating to the axilla and ULSB.   Abdomen: Soft, non-tender, +distended, BS + x 4.  Extremities: No clubbing, cyanosis. 3+ BL LE  Edema upto thighs. DP/PT/Radials 2+ and  equal bilaterally.  Accessory Clinical Findings  CBC  Recent Labs  12/06/15 1050 12/08/15 0758  WBC 8.4 8.9  NEUTROABS 6.4 6.5  HGB 14.6 15.7  HCT 40.7 43.3  MCV 112.7* 111.6*  PLT 107* 86*   Basic Metabolic Panel  Recent Labs  12/06/15 1050 12/08/15 0758  NA 125* 125*  K 3.5 3.6  CL 80* 78*  CO2 31 27  GLUCOSE 126* 104*  BUN 56* 67*  CREATININE 2.60* 3.14*  CALCIUM 8.5* 9.0  MG  --  1.9  PHOS  --  5.1*   Liver Function Tests  Recent Labs  12/06/15 1050 12/08/15 0758  AST 48*  --   ALT 19  --   ALKPHOS 91  --   BILITOT 8.6*  --   PROT 5.6*  --   ALBUMIN 2.7* 2.8*   No results for input(s): LIPASE, AMYLASE in the last 72 hours. Cardiac Enzymes No results for input(s):  CKTOTAL, CKMB, CKMBINDEX, TROPONINI in the last 72 hours. BNP Invalid input(s): POCBNP D-Dimer No results for input(s): DDIMER in the last 72 hours. Hemoglobin A1C No results for input(s): HGBA1C in the last 72 hours. Fasting Lipid Panel No results for input(s): CHOL, HDL, LDLCALC, TRIG, CHOLHDL, LDLDIRECT in the last 72 hours. Thyroid Function Tests No results for input(s): TSH, T4TOTAL, T3FREE, THYROIDAB in the last 72 hours.  Invalid input(s): FREET3  TELE  afib at rate of 110-120s  Radiology/Studies  Ct Abdomen Pelvis Wo Contrast  Result Date: 12/04/2015 CLINICAL DATA:  45 year old male with increased lower extremity swelling and abdominal pain. Jaundice. Initial encounter. EXAM: CT ABDOMEN AND PELVIS WITHOUT CONTRAST TECHNIQUE: Multidetector CT imaging of the abdomen and pelvis was performed following the standard protocol without IV contrast. COMPARISON:  CT chest abdomen and pelvis 05/05/2010. FINDINGS: New small to moderate bilateral layering pleural effusions with compressive lower lobe atelectasis. Cardiomegaly appears new since 2012. No pericardial effusion. No acute osseous abnormality identified. Anasarca, with diffuse abdominal wall and dependent flank subcutaneous  stranding. Moderate volume simple appearing free fluid in the pelvis. Small volume free fluid throughout the abdomen, including perihepatic and perisplenic fluid. Negative rectum and urinary bladder. Occasional sigmoid diverticula with no active inflammation. Decompressed left colon. Mild wall thickening of the transverse colon which might be reactive. More normal appearance of the right colon. Negative appendix. Decompressed terminal ileum. Oral contrast in the small bowel, but has not yet reached the TI. Small volume of oral contrast in the distal esophagus. Negative stomach. Decompressed duodenum. Mild hepatomegaly which is new since 2012. No discrete liver lesion identified in the absence of contrast. No intrahepatic or extrahepatic biliary ductal dilatation is evident on this noncontrast exam. Increased density within the gallbladder might be sludge. The main portal vein does not appear enlarged. Negative noncontrast spleen, no splenomegaly. Negative noncontrast pancreas and adrenal glands. Negative noncontrast kidneys. Aortoiliac calcified atherosclerosis noted. No lymphadenopathy identified. IMPRESSION: 1. Anasarca with abdominal and pelvic ascites and layering pleural effusions (small to moderate volume). 2. Cardiomegaly and hepatomegaly are new since 2012. No discrete liver lesion in the absence of contrast. No findings to suggest chronic portal hypertension. The top differential considerations in this clinical setting include heart failure with congestive hepatopathy versus acute or or chronic hepatitis. 3. Calcified aortic atherosclerosis. 4. Suggestion of gallbladder sludge. Electronically Signed   By: Odessa Fleming M.D.   On: 12/04/2015 20:49   Ct Head Wo Contrast  Result Date: 12/07/2015 CLINICAL DATA:  Fall.  History of atrial fibrillation. EXAM: CT HEAD WITHOUT CONTRAST TECHNIQUE: Contiguous axial images were obtained from the base of the skull through the vertex without intravenous contrast.  COMPARISON:  Head CT dated 05/05/2010. FINDINGS: Brain: There is generalized parenchymal atrophy, moderate in degree for age, with commensurate dilatation of the ventricles and sulci. There is no mass, hemorrhage, edema or other evidence of acute parenchymal abnormality. No extra-axial hemorrhage. Vascular: No hyperdense vessel or unexpected calcification. Skull: Negative for fracture or focal lesion. Sinuses/Orbits: No acute findings. Other: None. IMPRESSION: 1. No acute findings.  No intracranial mass, hemorrhage or edema. 2. Atrophy. Electronically Signed   By: Bary Richard M.D.   On: 12/07/2015 21:06   US Abdomen Complete  Result Date: 12/05/2015 CLINICAL DATA:  Patient with elevated LFTs. Anasarca. Alcohol abuse. EXAM: ABDOMEN ULTRASOUND COMPLETE COMPARISON:  CT abdomen pelvis 12/04/2015 FINDINGS: Gallbladder: Sludge within the gallbladder lumen. No gallbladder wall thickening or pericholecystic fluid. Negative sonographic Murphy's sign. Common bile duct: Diameter:  5 mm Liver: Diffusely increased in echogenicity. No focal lesion identified. IVC: No abnormality visualized. Pancreas: Visualized portion unremarkable. Spleen: Size and appearance within normal limits. Right Kidney: Length: 12.0 cm. Echogenicity within normal limits. No mass or hydronephrosis visualized. Left Kidney: Length: 12.4 cm. Echogenicity within normal limits. No mass or hydronephrosis visualized. Abdominal aorta: No aneurysm visualized. Other findings: Diffuse ascites.  Bilateral pleural effusions. IMPRESSION: Gallbladder sludge. No secondary signs to suggest acute cholecystitis. Hepatic steatosis. Ascites. Bilateral pleural effusions. Electronically Signed   By: Annia Belt M.D.   On: 12/05/2015 17:16   Dg Chest Port 1 View  Result Date: 12/05/2015 CLINICAL DATA:  45 year old male with cough and anasarca EXAM: PORTABLE CHEST 1 VIEW COMPARISON:  CT dated 05/05/2010 FINDINGS: There is mild cardiomegaly with increased vascular and  interstitial prominence compatible with congestive changes and mild interstitial edema. Superimposed pneumonia is not excluded. No focal consolidation, pleural effusion, or pneumothorax. No acute osseous pathology. IMPRESSION: Mild cardiomegaly and congestive changes.  No focal consolidation. Electronically Signed   By: Elgie Collard M.D.   On: 12/05/2015 00:39    ASSESSMENT AND PLAN   1. Shock with multisystem organ failure 2. Paroxysmal Afib with RVR - Hx of PAF. Self discontinued warfarin about 2 years ago due to lack of insurance as he lost his job.  3. Acute on chronic systolic HF with biventricular failure - EF 35-40% 4. Acute on chronic renal failure, stage IV 5. Hepatic failure with anasarca 6. ETOH abuse 7. Hyponatremia  He is in shock with multi-system organ failure. BP is very low. Suspect he has a significant component of cardiogenic shock with lactic acidosis. Prognosis is very poor. He has poor insight into how sick he is . We discussed code status and he was unable to make a clear decision but seems to desire full code for now.   I discussed the case with Dr. Jamison Neighbor in CCM and we both agree that there is little we can do to improve his long-term prognosis. However, given his desire for aggressive care, will start low-dose dobutamine to try to improve his cardiac status and facilitate diuresis. Will place PICC line to assess co-ox and cvp. He would not be candidate for HD. Will call Palliative Care Consult.  Will continue amio for now.(not good long term with liver failure). Stop metoprolol. Not candidate for anti-coagulation.  The patient is critically ill with multiple organ systems failure and requires high complexity decision making for assessment and support, frequent evaluation and titration of therapies, application of advanced monitoring technologies and extensive interpretation of multiple databases.   Critical Care Time devoted to patient care services described in  this note is 45 Minutes.  Junella Domke,MD 1:29 PM  Addendum:  I spoke with his parents at the bedside and updated them.  Zach Tietje,MD 2:49 PM

## 2015-12-08 NOTE — Progress Notes (Signed)
PULMONARY / CRITICAL CARE MEDICINE   Name: Austin [name] MRN: [id number] DOB: August 22, 1970    ADMISSION DATE:  12/04/2015 CONSULTATION DATE: June Leap  REFERRING MD: [facility]  CHIEF COMPLAINT: Abd tenderness  HISTORY OF PRESENT ILLNESS:   45 yo male , smoker, heavy drinker, non compliant with Afib medication(coumadin) due to loss of job. Presents 8/1 with CC: lower ext edema and abdominal discomfort. CT abd revealed anasarca , hepatomegaly, and he was given lasix fro lower ext edema. 8/2 early am he developed afib with RVR 150's and hypotension 80/50 and was given lasix with no response. PCCM called to bedside for hypotension.   SUBJECTIVE: Nurse reports patient complaining of intermittent dyspnea but then also takes off his nasal cannula and refuses his nebulizer therapies. Patient reports his dyspnea "comes and goes". Denies any chest pain or pressure. Denies any coughing. Didn't sleep much last night.  REVIEW OF SYSTEMS:  No nausea, emesis, or abdominal pain. No fever, chills, or sweats. No headaches or vision changes.   VITAL SIGNS: BP (!) 79/57   Pulse 96   Temp 98.4 F (36.9 C) (Oral)   Resp 19   Ht 6\' 3"  (1.905 m)   Wt 204 lb 9.4 oz (92.8 kg)   SpO2 96%   BMI 25.57 kg/m   HEMODYNAMICS:    VENTILATOR SETTINGS:    INTAKE / OUTPUT: I/O last 3 completed shifts: In: 250 [P.O.:250] Out: 300 [Urine:300]  PHYSICAL EXAMINATION: General:  Laying on left side. No distress. Eyes closed but wakes up easily. Neuro:  No meningismus. A&O x3. Moving all 4 extremities equally. HEENT:  Dry mucus membranes. No scleral injection. Pupils symmetric. Cardiovascular:  Irregular rhythm. Edema.  Pulmonary:  Decreased breath sounds in bases. Normal work of breathing on room air with nasal cannula on bridge of nose. Speaking in complete sentences. Abdomen:  Soft. Protuberant. Nontender. Integument:  Warm & dry. No rash on exposed skin.  LABS:  BMET  Recent Labs Lab 12/04/15 1355  12/05/15 0718 12/06/15 1050  NA 125* 126*  125* 125*  K 3.5 3.8  3.9 3.5  CL 78* 78*  78* 80*  CO2 24 33*  32 31  BUN 49* 50*  51* 56*  CREATININE 2.61* 2.64*  2.58* 2.60*  GLUCOSE 116* 104*  104* 126*    Electrolytes  Recent Labs Lab 12/04/15 1355 12/04/15 2337 12/05/15 0718 12/06/15 1050  CALCIUM 8.5*  --  8.7*  8.7* 8.5*  MG  --  1.7 1.7  --   PHOS  --   --  4.2  --     CBC  Recent Labs Lab 12/04/15 1355 12/05/15 0718 12/06/15 1050  WBC 10.3 9.0 8.4  HGB 14.6 14.5 14.6  HCT 41.2 40.5 40.7  PLT 134* 121* 107*    Coag's  Recent Labs Lab 12/04/15 2137  INR 1.99    Sepsis Markers  Recent Labs Lab 12/05/15 0718 12/05/15 0943 12/07/15 0707  LATICACIDVEN 3.7* 3.4*  --   PROCALCITON 1.48  --  0.89    ABG No results for input(s): PHART, PCO2ART, PO2ART in the last 168 hours.  Liver Enzymes  Recent Labs Lab 12/04/15 1355 12/05/15 0718 12/06/15 1050  AST 57* 51* 48*  ALT 27 24 19   ALKPHOS 99 98 91  BILITOT 12.2* 9.7* 8.6*  ALBUMIN 3.0* 2.8* 2.7*    Cardiac Enzymes  Recent Labs Lab 12/04/15 2337 12/05/15 0718 12/05/15 1026  TROPONINI 0.16* 0.14* 0.18*    Glucose No results for input(s):  GLUCAP in the last 168 hours.  Imaging Ct Head Wo Contrast  Result Date: 12/07/2015 CLINICAL DATA:  Fall.  History of atrial fibrillation. EXAM: CT HEAD WITHOUT CONTRAST TECHNIQUE: Contiguous axial images were obtained from the base of the skull through the vertex without intravenous contrast. COMPARISON:  Head CT dated 05/05/2010. FINDINGS: Brain: There is generalized parenchymal atrophy, moderate in degree for age, with commensurate dilatation of the ventricles and sulci. There is no mass, hemorrhage, edema or other evidence of acute parenchymal abnormality. No extra-axial hemorrhage. Vascular: No hyperdense vessel or unexpected calcification. Skull: Negative for fracture or focal lesion. Sinuses/Orbits: No acute findings. Other: None.  IMPRESSION: 1. No acute findings.  No intracranial mass, hemorrhage or edema. 2. Atrophy. Electronically Signed   By: Bary Richard M.D.   On: 12/07/2015 21:06     STUDIES:  CT ABD/PELVIS 8/1: TTE 8/2: Reduced LVEF 35-40%, diffuse hypokinesis. PAP Port CXR 8/2:  Suggestion of cardiomegaly. Increased vascular congestion & interstitial prominence. No focal consolidation. Abd U/S 8/2:  GB sludge. No findings to suggest acute cholecystitis. Ascite w/ hepatic steatosis noted. Bilateral pleural effusions. CT Head w/o 8/4:  No acute findings. No mass or hemorrhage. Atrophy noted.  MICROBIOLOGY: MRSA PCR 8/2:  Negative Urine Ctx 8/2:  Negative Blood Ctx x2 8/1 >>>  ANTIBIOTICS: Levaquin 8/1 >>  SIGNIFICANT EVENTS: 8/01 - Admit 8/02 - Transfer to SDU for hypotension & A fib w/ RVR 8/04 - Fall  LINES/TUBES: PIV x1  ASSESSMENT / PLAN:  PULMONARY A: Acute Hypoxic Respiratory Failure - Secondary to pulmonary edema.  P:   Weaning FiO2 for Sat >92% Incentive Spirometry for Pulmonary Toilette Xopenex neb prn  CARDIOVASCULAR A:  Paroxysmal Atrial Fibrillation - RVR resolved. Nonadherent to medications. Hypotension - Due to 3rd spacing.  P:  Cardiology Consulted Albumin as needed for low BP Monitoring on telemetry  Vitals per unit protocol Amiodarone 400mg  PO bid Lopressor 12.5mg  PO bid Holding on Lasix diuresis given acute renal failure  RENAL A:   Acute Renal Failure - Suspect pre-renal. Hyponatremia - Stable. Lactic Acidosis - Improving. Likely due to poor hepatic & renal clearance.  P:   Holding on further lasix diuresis Monitoring I/O Checking electrolytes & renal function this morning Repeat Lactic Acid this morning  GASTROINTESTINAL A:   H/O Alcoholic Hepatitis Ascites  P:   Heart Healthy Diet  HEMATOLOGIC A:   Thrombocytopenia Coagulopathy  H/O Nonadherence to Coumadin for A fib  P:  May need anticoagulation but defer to primary  service Checking CBC & INR this morning SCDs  INFECTIOUS A:   Possible Sepsis  P:   Levaquin Day #5 Empiric Trending PCT per algorithm  Could consider paracentesis but defer to primary service  ENDOCRINE A:   No acute issues    P:   Follow glucose on daily labs  NEUROLOGIC A:   H/O EtOH Abuse  P:   RASS goal:  0 Limit sedatives Thiamine PO/IV daily Folic Acid PO daily Ativan 1mg  PO q12hr  FAMILY  - Updates: Patient updated at bedside. - Inter-disciplinary family meet or Palliative Care meeting due by:  8/9  TODAY'S SUMMARY:  45 y.o. male , smoker, heavy drinker, cirrhosis, non compliant with Afib medication(coumadin) due to loss of job. Presents 8/1 with CC: lower ext edema and abdominal discomfort. Patient with acute renal failure due to intravascular volume depletion. Holding on further diuresis. Currently on Lopressor and Amiodarone for his A fib with telemetry monitoring. Respiratory status overall seems stable &  would be better if patient used his oxygen therapy. Repeating electrolytes this morning to continue to trend hyponatremia and INR to trend his coagulopathy. I will defer to his primary service on managing his multiple medical problems. We will be available as needed tomorrow and touch base on Monday. Please call if there are any concerns or questions.  Donna Christen Jamison Neighbor, M.D. [facility]monary [facility] Pager:  [phone] After 3pm or if no response, call [phone] 7:29 AM 12/08/15

## 2015-12-08 NOTE — Clinical Social Work Note (Signed)
Clinical Social Work Assessment  Patient Details  Name: Austin [name] MRN: [id number] Date of Birth: 1970/09/17  Date of referral:  12/08/15               Reason for consult:  Substance Use/ETOH Abuse                Permission sought to share information with:    Permission granted to share information::  No  Name::        Agency::     Relationship::     Contact Information:     Housing/Transportation Living arrangements for the past 2 months:   (Too lethargic to discuss) Source of Information:  Patient, Medical Team Patient Interpreter Needed:  None Criminal Activity/Legal Involvement Pertinent to Current Situation/Hospitalization:  No - Comment as needed Significant Relationships:  Parents Lives with:  Self Do you feel safe going back to the place where you live?  Yes Need for family participation in patient care:  Yes (Comment)  Care giving concerns:  Patient in need of substance abuse resources.   Social Worker assessment / plan:  CSW met with patient. No supports at bedside. CSW introduced role and inquired about interest in substance abuse resources. Patient very lethargic and could barely keep his eyes open but responded to questions. Patient said that he would take the substance abuse resources. CSW explained that both residential and outpatient treatment was listed. Patient expressed understanding. Patient reported no other needs. CSW encouraged patient to contact CSW as needed. CSW will sign off for now. Consult again if any other social work needs arise.  Employment status:  [employer] information:  Self Pay (Medicaid Pending) PT Recommendations:  Not assessed at this time Information / Referral to community resources:  Residential Substance Abuse Treatment Options, Outpatient Substance Abuse Treatment Options  Patient/Family's Response to care:  Patient agreeable to accepting substance abuse resources. Patient lives alone, per RN report, but father is listed  as emergency contact. Patient appreciated social work intervention.  Patient/Family's Understanding of and Emotional Response to Diagnosis, Current Treatment, and Prognosis:  Patient is very lethargic so it is difficult to tell if he is happy with care or not. Patient understands reason for CSW consult for substance abuse resources.  Emotional Assessment Appearance:  Appears stated age Attitude/Demeanor/Rapport:  Lethargic Affect (typically observed):  Quiet Orientation:  Oriented to Self, Oriented to Place, Oriented to  Time, Oriented to Situation Alcohol / Substance use:  Alcohol Use Psych involvement (Current and /or in the community):  No (Comment)  Discharge Needs  Concerns to be addressed:  Substance Abuse Concerns Readmission within the last 30 days:  No Current discharge risk:  Lives alone, Substance Abuse Barriers to Discharge:  No Barriers Identified   Candie Chroman, LCSW 12/08/2015, 10:04 AM

## 2015-12-08 NOTE — Progress Notes (Signed)
Critical lactic acid 3.8 called to RN. Have paged Dr. Jamison Neighbor to notify. Awaiting call.

## 2015-12-09 DIAGNOSIS — Z515 Encounter for palliative care: Secondary | ICD-10-CM

## 2015-12-09 DIAGNOSIS — K709 Alcoholic liver disease, unspecified: Secondary | ICD-10-CM

## 2015-12-09 DIAGNOSIS — R17 Unspecified jaundice: Secondary | ICD-10-CM

## 2015-12-09 DIAGNOSIS — R570 Cardiogenic shock: Secondary | ICD-10-CM

## 2015-12-09 LAB — PROCALCITONIN: Procalcitonin: 1.35 ng/mL

## 2015-12-09 LAB — CULTURE, BLOOD (ROUTINE X 2)
Culture: NO GROWTH
Culture: NO GROWTH

## 2015-12-09 LAB — CARBOXYHEMOGLOBIN
Carboxyhemoglobin: 1.4 % (ref 0.5–1.5)
METHEMOGLOBIN: 0.8 % (ref 0.0–1.5)
O2 Saturation: 59.1 %
Total hemoglobin: 12.8 g/dL — ABNORMAL LOW (ref 13.5–18.0)

## 2015-12-09 LAB — BASIC METABOLIC PANEL
Anion gap: 13 (ref 5–15)
BUN: 64 mg/dL — AB (ref 6–20)
CHLORIDE: 77 mmol/L — AB (ref 101–111)
CO2: 33 mmol/L — ABNORMAL HIGH (ref 22–32)
Calcium: 8.7 mg/dL — ABNORMAL LOW (ref 8.9–10.3)
Creatinine, Ser: 3.07 mg/dL — ABNORMAL HIGH (ref 0.61–1.24)
GFR, EST AFRICAN AMERICAN: 26 mL/min — AB (ref >60)
GFR, EST NON AFRICAN AMERICAN: 23 mL/min — AB (ref >60)
Glucose, Bld: 119 mg/dL — ABNORMAL HIGH (ref 65–99)
POTASSIUM: 3.1 mmol/L — AB (ref 3.5–5.1)
SODIUM: 123 mmol/L — AB (ref 135–145)

## 2015-12-09 LAB — LACTIC ACID, PLASMA: LACTIC ACID, VENOUS: 2.1 mmol/L — AB (ref 0.5–1.9)

## 2015-12-09 LAB — AMMONIA: Ammonia: 26 umol/L (ref 9–35)

## 2015-12-09 MED ORDER — POTASSIUM CHLORIDE CRYS ER 20 MEQ PO TBCR
40.0000 meq | EXTENDED_RELEASE_TABLET | Freq: Once | ORAL | Status: AC
  Administered 2015-12-09: 40 meq via ORAL
  Filled 2015-12-09: qty 2

## 2015-12-09 MED ORDER — AMIODARONE HCL IN DEXTROSE 360-4.14 MG/200ML-% IV SOLN
30.0000 mg/h | INTRAVENOUS | Status: DC
  Administered 2015-12-09 – 2015-12-15 (×12): 30 mg/h via INTRAVENOUS
  Filled 2015-12-09 (×12): qty 200

## 2015-12-09 MED ORDER — AMIODARONE LOAD VIA INFUSION
150.0000 mg | Freq: Once | INTRAVENOUS | Status: AC
  Administered 2015-12-09: 150 mg via INTRAVENOUS
  Filled 2015-12-09: qty 83.34

## 2015-12-09 MED ORDER — FUROSEMIDE 10 MG/ML IJ SOLN
20.0000 mg/h | INTRAVENOUS | Status: DC
  Administered 2015-12-09 – 2015-12-13 (×6): 15 mg/h via INTRAVENOUS
  Administered 2015-12-13 – 2015-12-14 (×2): 20 mg/h via INTRAVENOUS
  Filled 2015-12-09 (×19): qty 25

## 2015-12-09 MED ORDER — SPIRONOLACTONE 25 MG PO TABS
25.0000 mg | ORAL_TABLET | Freq: Every day | ORAL | Status: DC
  Administered 2015-12-09 – 2015-12-17 (×9): 25 mg via ORAL
  Filled 2015-12-09 (×9): qty 1

## 2015-12-09 MED ORDER — AMIODARONE HCL IN DEXTROSE 360-4.14 MG/200ML-% IV SOLN
60.0000 mg/h | INTRAVENOUS | Status: AC
  Administered 2015-12-09 (×2): 60 mg/h via INTRAVENOUS
  Filled 2015-12-09 (×2): qty 200

## 2015-12-09 NOTE — Consult Note (Signed)
Consultation Note Date: 12/09/2015   Patient Name: Austin [name]  DOB: 1970-10-02  MRN: [id number]  Age / Sex: 45 y.o., male  PCP: Tamsen Roers, MD Referring Physician: Velvet Bathe, MD  Reason for Consultation: Establishing goals of care and Psychosocial/spiritual support  HPI/Patient Profile: 45 y.o. male  with past medical history of Atrial fibrillation, alcohol abuse, opioid use disorder, systolic heart failure admitted on 12/04/2015 with increasing lower extremity edema, abdominal discomfort. While in the emergency room he was found to be in atrial fibrillation with RVR. He also was very hypotensive requiring a fluid bolus. Since admission he has become sicker and is now with multisystem failure: Worsening systolic heart function with an ejection fraction of 35%, kidney failure and now is on a dobutamine drip to maintain blood pressure. His last drink of alcohol was 2 days prior to admission  Clinical Assessment and Goals of Care: Patient is alert; able to answer simple brief questions. He is denying pain but states he feels restless. He is on CIWA protocol. I met with patient individually for a few minutes and he asked me to share with him what his clinical condition is. I began to discuss his liver failure with associated heart and kidney failure, and he asked me "what do I need to expect". At that point he asked me to go get his parents.  At parents request I met with them outside of the room initially. I did share with them that my fear if things were to continue to go the way they are now he has a prognosis of days to weeks depending on whether he is able to come off of the dobutamine it could be less. Introduced the concept of full code and DO NOT RESUSCITATE. Did attempt to prepare patient's parents for end-of-life. Father stated that he been trying to get his son to go to the doctor 4 weeks but he had refused  because "they're just continue tell me I'm dying".   Patient can still participate in goals of care discussion but only to a limited degree. He is divorced. His parents are his decision makers    SUMMARY OF RECOMMENDATIONS   Continue full code for now. I did try to stress the urgency of giving US guidance in this regard. I explained at this point his dependency on dobutamine to maintain his blood pressure; that this also was a form of life support  I shared with parents that I was concerned that he could have an acute event and that if in fact he would not want to be put on a ventilator, CPR or defibrillation it would be best to move forward with that aspect of decision making. Patient's father said he would like to speak his son alone regarding this matter  Reiterated in the setting of a DO NOT RESUSCITATE, that does not mean do not treat. That we would continue to try to do everything for their son, treat the treatable  I did share a prognosis of just days to weeks  Code Status/Advance Care Planning:  Full code    Symptom Management:  Pain: As managed by CCM ;would recommend fentanyl or Dilaudid low-dose. 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 Anasarca .  Atrial fibrillation with RVR ([place]) . Alcoholic liver disease ([place]) . ARF (acute renal failure) ([place]) . Alcohol abuse   I have reviewed the medical record, interviewed the patient and family, and examined the patient. The following aspects are pertinent.  Past Medical History:  Diagnosis Date  . Alcoholic hepatitis   . Anasarca 12/2015  . ARF (acute renal failure) ([place]) 12/2015  . Atrial fibrillation [facility])    Social History   Social History  . Marital status: Single    Spouse name: N/A  . Number of children: N/A  . Years of education: N/A   Social History Main Topics  . Smoking status: Current Every Day Smoker    Packs/day: 1.00    Years: 25.00    Types: Cigarettes  . Smokeless tobacco: Never Used  . Alcohol use 50.4 oz/week    84 Cans of beer per week     Comment: 12 pk per day  . Drug use: No  . Sexual activity: Not Asked   Other Topics Concern  . None   Social History Narrative  . None   Family History  Problem Relation Age of Onset  . Diabetes Paternal Uncle    Scheduled Meds: . folic acid  1 mg Oral Daily  . levofloxacin  750 mg Oral Q48H  . multivitamin with minerals  1 tablet Oral Daily  . potassium chloride  40 mEq Oral Once  . protein supplement  2 oz Oral QID  . sodium chloride flush  10-40 mL Intracatheter Q12H  . spironolactone  25 mg Oral Daily  . thiamine  100 mg Oral Daily   Or  . thiamine  100 mg Intravenous Daily   Continuous Infusions: . amiodarone 60 mg/hr (12/09/15 1318)   Followed by  . amiodarone    . DOBUTamine 4 mcg/kg/min (12/09/15 1100)  . furosemide (LASIX) infusion     PRN Meds:.levalbuterol, ondansetron **OR** ondansetron (ZOFRAN) IV, sodium chloride flush Medications Prior to Admission:  Prior to Admission medications   Not on File   Allergies  Allergen Reactions  . Penicillins     Has patient had a PCN reaction causing immediate rash, facial/tongue/throat swelling, SOB or lightheadedness with hypotension: YES Has  patient had a PCN reaction causing severe rash involving mucus membranes or skin necrosis: NO Has patient had a PCN reaction that required hospitalization NO Has patient had a PCN reaction occurring within the last 10 years: NO If all of the above answers are "NO", then may proceed with Cephalosporin use.   Review of Systems  Unable to perform ROS: Mental status change    Physical Exam  Constitutional: He is oriented to person,  place, and time. He appears well-developed.  Acutely ill appearing man  HENT:  Head: Normocephalic and atraumatic.  Eyes: Scleral icterus is present.  Neck: Normal range of motion.  Cardiovascular:  tachy  Pulmonary/Chest: Effort normal.  Abdominal: He exhibits distension.  Musculoskeletal: Normal range of motion. He exhibits edema.  Neurological: He is alert and oriented to person, place, and time.  Pt can answer simple questions. Oriented to person, place year and generally that he is sick  Skin:  jaundiced  Psychiatric:  Impulsive; trying to climb out of bed  Nursing note and vitals reviewed.   Vital Signs: BP 92/72 (BP Location: Left Arm)   Pulse (!) 119   Temp 97.1 F (36.2 C) (Axillary)   Resp 17   Ht _0  (1.905 m)   Wt 93.3 kg (205 lb 11 oz)   SpO2 99%   BMI 25.71 kg/m  Pain Assessment: No/denies pain POSS *See Group Information*: S-Acceptable,Sleep, easy to arouse Pain Score: 0-No pain   SpO2: SpO2: 99 % O2 Device:SpO2: 99 % O2 Flow Rate: .O2 Flow Rate (L/min): 4 L/min  IO: Intake/output summary:  Intake/Output Summary (Last 24 hours) at 12/09/15 1325 Last data filed at 12/09/15 1220  Gross per 24 hour  Intake           610.12 ml  Output             1101 ml  Net          -490.88 ml    LBM: Last BM Date: 12/08/15 Baseline Weight: Weight: 92.1 kg (203 lb 0.7 oz) Most recent weight: Weight: 93.3 kg (205 lb 11 oz)     Palliative Assessment/Data:   Flowsheet Rows   Flowsheet Row Most Recent Value  Intake Tab  Referral  Department  Hospitalist  Unit at Time of Referral  ICU  Palliative Care Primary Diagnosis  Cardiac  Date Notified  12/08/15  Palliative Care Type  New Palliative care  Reason for referral  Clarify Goals of Care, Psychosocial or Spiritual support  Date of Admission  12/04/15  Date first seen by Palliative Care  12/09/15  # of days Palliative referral response time  1 Day(s)  # of days IP prior to Palliative referral  4  Clinical Assessment  Palliative Performance Scale Score  30%  Pain Max last 24 hours  Not able to report  Pain Min Last 24 hours  Not able to report  Dyspnea Max Last 24 Hours  Not able to report  Dyspnea Min Last 24 hours  Not able to report  Nausea Max Last 24 Hours  Not able to report  Nausea Min Last 24 Hours  Not able to report  Anxiety Max Last 24 Hours  Not able to report  Anxiety Min Last 24 Hours  Not able to report  Other Max Last 24 Hours  Not able to report  Psychosocial & Spiritual Assessment  Palliative Care Outcomes  Patient/Family meeting held?  Yes  Who was at the meeting?  parents  Palliative Care follow-up planned  Yes, Facility      Time In: 1100 Time Out: 1220 Time Total: 80 min Greater than 50%  of this time was spent counseling and coordinating care related to the above assessment and plan.  Signed by: Dory Horn, NP   Please contact Palliative Medicine Team phone at [phone] for questions and concerns.  For individual provider: See Shea Evans

## 2015-12-09 NOTE — Progress Notes (Signed)
Patient Name: Austin [name] Date of Encounter: 12/09/2015  45 yo man with ongoing alcohol abuse, systolic HF and history of AF treated admitted with HF, renal failure and liver failure.   SUBJECTIVE  Started on dobutamine yesterday for cardiogenic shock and hypotension. Now on 63mcg/kg/min. Co-ox much improved. 37%->59% CVP still high (20) but with poor urine output on IV lasix. Remains in AF ~ 100.   Says he feels a bit better    Echo shows moderate LV dysfunction - EF 35-40%. RV severely HK  Moderate PHTN Moderate - severe MR   CURRENT MEDS . amiodarone  200 mg Oral BID  . folic acid  1 mg Oral Daily  . furosemide  80 mg Intravenous BID  . levofloxacin  750 mg Oral Q48H  . multivitamin with minerals  1 tablet Oral Daily  . protein supplement  2 oz Oral QID  . sodium chloride flush  10-40 mL Intracatheter Q12H  . thiamine  100 mg Oral Daily   Or  . thiamine  100 mg Intravenous Daily    OBJECTIVE  Vitals:   12/09/15 0900 12/09/15 1000 12/09/15 1100 12/09/15 1219  BP: (!) 83/71 (!) 77/67 (!) 80/71 92/72  Pulse: 96 97 (!) 106 (!) 119  Resp: (!) 32 17 (!) 25 17  Temp:    97.1 F (36.2 C)  TempSrc:    Axillary  SpO2: 96% 97% 95% 99%  Weight:      Height:        Intake/Output Summary (Last 24 hours) at 12/09/15 1232 Last data filed at 12/09/15 1220  Gross per 24 hour  Intake           610.12 ml  Output             1101 ml  Net          -490.88 ml   Filed Weights   12/06/15 0525 12/07/15 0300 12/09/15 0324  Weight: 92 kg (202 lb 13.2 oz) 92.8 kg (204 lb 9.4 oz) 93.3 kg (205 lb 11 oz)    PHYSICAL EXAM Current BP is 103/81    HR is 123  General: ill appearing male in NAD.   Skin is sallow / yellow in color   Neuro: Alert and oriented X 3. Moves all extremities spontaneously. Psych: Normal affect. HEENT:  scleral icterus.Temporal wasting  Neck: Supple without bruits. JVP to jaw  Lungs:  Clear dull at basese Heart: PMI laterally displaced irrg  +s3 3/6  systolic murmur radiating to the axilla and ULSB.   Abdomen: Soft, non-tender, +distended, BS + x 4.  Extremities: No clubbing, cyanosis. 3+ BL LE  Edema upto thighs. DP/PT/Radials 2+ and equal bilaterally.  Accessory Clinical Findings  CBC  Recent Labs  12/08/15 0758  WBC 8.9  NEUTROABS 6.5  HGB 15.7  HCT 43.3  MCV 111.6*  PLT 86*   Basic Metabolic Panel  Recent Labs  12/08/15 0758 12/09/15 0842  NA 125* 123*  K 3.6 3.1*  CL 78* 77*  CO2 27 33*  GLUCOSE 104* 119*  BUN 67* 64*  CREATININE 3.14* 3.07*  CALCIUM 9.0 8.7*  MG 1.9  --   PHOS 5.1*  --    Liver Function Tests  Recent Labs  12/08/15 0758  ALBUMIN 2.8*   No results for input(s): LIPASE, AMYLASE in the last 72 hours. Cardiac Enzymes No results for input(s): CKTOTAL, CKMB, CKMBINDEX, TROPONINI in the last 72 hours. BNP Invalid input(s): POCBNP D-Dimer No results  for input(s): DDIMER in the last 72 hours. Hemoglobin A1C No results for input(s): HGBA1C in the last 72 hours. Fasting Lipid Panel No results for input(s): CHOL, HDL, LDLCALC, TRIG, CHOLHDL, LDLDIRECT in the last 72 hours. Thyroid Function Tests No results for input(s): TSH, T4TOTAL, T3FREE, THYROIDAB in the last 72 hours.  Invalid input(s): FREET3  TELE  afib at rate of 110-120s  Radiology/Studies  Ct Abdomen Pelvis Wo Contrast  Result Date: 12/04/2015 CLINICAL DATA:  45 year old male with increased lower extremity swelling and abdominal pain. Jaundice. Initial encounter. EXAM: CT ABDOMEN AND PELVIS WITHOUT CONTRAST TECHNIQUE: Multidetector CT imaging of the abdomen and pelvis was performed following the standard protocol without IV contrast. COMPARISON:  CT chest abdomen and pelvis 05/05/2010. FINDINGS: New small to moderate bilateral layering pleural effusions with compressive lower lobe atelectasis. Cardiomegaly appears new since 2012. No pericardial effusion. No acute osseous abnormality identified. Anasarca, with diffuse abdominal  wall and dependent flank subcutaneous stranding. Moderate volume simple appearing free fluid in the pelvis. Small volume free fluid throughout the abdomen, including perihepatic and perisplenic fluid. Negative rectum and urinary bladder. Occasional sigmoid diverticula with no active inflammation. Decompressed left colon. Mild wall thickening of the transverse colon which might be reactive. More normal appearance of the right colon. Negative appendix. Decompressed terminal ileum. Oral contrast in the small bowel, but has not yet reached the TI. Small volume of oral contrast in the distal esophagus. Negative stomach. Decompressed duodenum. Mild hepatomegaly which is new since 2012. No discrete liver lesion identified in the absence of contrast. No intrahepatic or extrahepatic biliary ductal dilatation is evident on this noncontrast exam. Increased density within the gallbladder might be sludge. The main portal vein does not appear enlarged. Negative noncontrast spleen, no splenomegaly. Negative noncontrast pancreas and adrenal glands. Negative noncontrast kidneys. Aortoiliac calcified atherosclerosis noted. No lymphadenopathy identified. IMPRESSION: 1. Anasarca with abdominal and pelvic ascites and layering pleural effusions (small to moderate volume). 2. Cardiomegaly and hepatomegaly are new since 2012. No discrete liver lesion in the absence of contrast. No findings to suggest chronic portal hypertension. The top differential considerations in this clinical setting include heart failure with congestive hepatopathy versus acute or or chronic hepatitis. 3. Calcified aortic atherosclerosis. 4. Suggestion of gallbladder sludge. Electronically Signed   By: Odessa FlemingH  Hall M.D.   On: 12/04/2015 20:49   Ct Head Wo Contrast  Result Date: 12/07/2015 CLINICAL DATA:  Fall.  History of atrial fibrillation. EXAM: CT HEAD WITHOUT CONTRAST TECHNIQUE: Contiguous axial images were obtained from the base of the skull through the vertex  without intravenous contrast. COMPARISON:  Head CT dated 05/05/2010. FINDINGS: Brain: There is generalized parenchymal atrophy, moderate in degree for age, with commensurate dilatation of the ventricles and sulci. There is no mass, hemorrhage, edema or other evidence of acute parenchymal abnormality. No extra-axial hemorrhage. Vascular: No hyperdense vessel or unexpected calcification. Skull: Negative for fracture or focal lesion. Sinuses/Orbits: No acute findings. Other: None. IMPRESSION: 1. No acute findings.  No intracranial mass, hemorrhage or edema. 2. Atrophy. Electronically Signed   By: Bary RichardStan  Maynard M.D.   On: 12/07/2015 21:06   [place] Abdomen Complete  Result Date: 12/05/2015 CLINICAL DATA:  Patient with elevated LFTs. Anasarca. Alcohol abuse. EXAM: ABDOMEN ULTRASOUND COMPLETE COMPARISON:  CT abdomen pelvis 12/04/2015 FINDINGS: Gallbladder: Sludge within the gallbladder lumen. No gallbladder wall thickening or pericholecystic fluid. Negative sonographic Murphy's sign. Common bile duct: Diameter: 5 mm Liver: Diffusely increased in echogenicity. No focal lesion identified. IVC: No abnormality visualized. Pancreas:  Visualized portion unremarkable. Spleen: Size and appearance within normal limits. Right Kidney: Length: 12.0 cm. Echogenicity within normal limits. No mass or hydronephrosis visualized. Left Kidney: Length: 12.4 cm. Echogenicity within normal limits. No mass or hydronephrosis visualized. Abdominal aorta: No aneurysm visualized. Other findings: Diffuse ascites.  Bilateral pleural effusions. IMPRESSION: Gallbladder sludge. No secondary signs to suggest acute cholecystitis. Hepatic steatosis. Ascites. Bilateral pleural effusions. Electronically Signed   By: Annia Belt M.D.   On: 12/05/2015 17:16   Dg Chest Port 1 View  Addendum Date: 12/08/2015   ADDENDUM REPORT: 12/08/2015 20:03 ADDENDUM: The PICC line terminates in the central SVC. Electronically Signed   By: Gerome Sam III M.D   On:  12/08/2015 20:03   Result Date: 12/08/2015 CLINICAL DATA:  Ongoing alcohol abuse. History of renal and liver failure. EXAM: PORTABLE CHEST 1 VIEW COMPARISON:  December 05, 2015 FINDINGS: No pneumothorax. Diffuse bilateral interstitial opacities consistent with edema. More focal opacity in the right base. Mild cardiomegaly. No other acute abnormalities. IMPRESSION: 1. Pulmonary edema. 2. More focal opacity in the right base could represent superimposed developing infiltrate or asymmetric edema. Recommend clinical correlation and follow-up to resolution. Electronically Signed: By: Gerome Sam III M.D On: 12/08/2015 15:54   Dg Chest Port 1 View  Result Date: 12/05/2015 CLINICAL DATA:  45 year old male with cough and anasarca EXAM: PORTABLE CHEST 1 VIEW COMPARISON:  CT dated 05/05/2010 FINDINGS: There is mild cardiomegaly with increased vascular and interstitial prominence compatible with congestive changes and mild interstitial edema. Superimposed pneumonia is not excluded. No focal consolidation, pleural effusion, or pneumothorax. No acute osseous pathology. IMPRESSION: Mild cardiomegaly and congestive changes.  No focal consolidation. Electronically Signed   By: Elgie Collard M.D.   On: 12/05/2015 00:39    ASSESSMENT AND PLAN   1. Shock with multisystem organ failure 2. Paroxysmal Afib with RVR - Hx of PAF. Self discontinued warfarin about 2 years ago due to lack of insurance as he lost his job.  3. Acute on chronic systolic HF with biventricular failure - EF 35-40% 4. Acute on chronic renal failure, stage IV 5. Hepatic failure with anasarca 6. ETOH abuse 7. Hyponatremia  He is in cardiogenic shock with multi-system organ failure. Co-ox much improved on dobutamine. Will continue. However still not diuresing well. Will switch to lasix gtt. Place TED hose.   Will start IV amio for short-term use to help with AF. Not good candidate for long-term amio of AC due to ETOH cirrhosis and liver  failure.   I had long talk with him about how sick he is and fact that even if we turn around his shock in the short-term, I feel that he has < 6 months to live. Have strongly suggestive DNR/DNI and Hospice but he refuses at this time.   The patient is critically ill with multiple organ systems failure and requires high complexity decision making for assessment and support, frequent evaluation and titration of therapies, application of advanced monitoring technologies and extensive interpretation of multiple databases.   Critical Care Time devoted to patient care services described in this note is 45 Minutes.  Braeleigh Pyper,MD 12:32 PM

## 2015-12-09 NOTE — Progress Notes (Addendum)
Lactic acid critical result called to RN. Lactic acid is 2.1 at this time. Sent message to Dr. Cena BentonVega. Level is improved from yesterday.

## 2015-12-09 NOTE — Progress Notes (Signed)
Patient more alert this morning. Poor appetite, no complaint of pain. Family visiting, Young son (13) visiting for the first time in a very long while. Family and RN have previously discussed that if the visits cause any distress they will be asked to step out for patients safety and well-being, all are in agreement. Quiet visit, patient did well, no visible stress.

## 2015-12-09 NOTE — Progress Notes (Signed)
  Amiodarone Drug - Drug Interaction Consult Note  Recommendations: Patient noted to be on Levaquin which can prolong the QTc interval, however, anticipate this should be discontinued in the next few days. If patient is to remain on Levaquin and/or other QT-prolonging medications, recommend ordering EKG to evaluate interval. Patient also noted to be on an furosemide infusion and oral spironolactone. Recommend close monitoring for hypokalemia. Replete potassium, as well as magnesium, as clinically indicated.   Amiodarone is metabolized by the cytochrome P450 system and therefore has the potential to cause many drug interactions. Amiodarone has an average plasma half-life of 50 days (range 20 to 100 days).   There is potential for drug interactions to occur several weeks or months after stopping treatment and the onset of drug interactions may be slow after initiating amiodarone.   []  Statins: Increased risk of myopathy. Simvastatin- restrict dose to 20mg  daily. Other statins: counsel patients to report any muscle pain or weakness immediately.  []  Anticoagulants: Amiodarone can increase anticoagulant effect. Consider warfarin dose reduction. Patients should be monitored closely and the dose of anticoagulant altered accordingly, remembering that amiodarone levels take several weeks to stabilize.  []  Antiepileptics: Amiodarone can increase plasma concentration of phenytoin, the dose should be reduced. Note that small changes in phenytoin dose can result in large changes in levels. Monitor patient and counsel on signs of toxicity.  []  Beta blockers: increased risk of bradycardia, AV block and myocardial depression. Sotalol - avoid concomitant use.  []   Calcium channel blockers (diltiazem and verapamil): increased risk of bradycardia, AV block and myocardial depression.  []   Cyclosporine: Amiodarone increases levels of cyclosporine. Reduced dose of cyclosporine is recommended.  []  Digoxin dose should  be halved when amiodarone is started.  [x]  Diuretics: increased risk of cardiotoxicity if hypokalemia occurs.  []  Oral hypoglycemic agents (glyburide, glipizide, glimepiride): increased risk of hypoglycemia. Patient's glucose levels should be monitored closely when initiating amiodarone therapy.   [x]  Drugs that prolong the QT interval:  Torsades de pointes risk may be increased with concurrent use - avoid if possible.  Monitor QTc, also keep magnesium/potassium WNL if concurrent therapy can't be avoided. Marland Kitchen. Antibiotics: e.g. fluoroquinolones, erythromycin. . Antiarrhythmics: e.g. quinidine, procainamide, disopyramide, sotalol. . Antipsychotics: e.g. phenothiazines, haloperidol.  . Lithium, tricyclic antidepressants, and methadone.   Thank You, York CeriseKatherine Cook, PharmD Pharmacy Resident  Pager [phone] 12/09/15 12:43 PM

## 2015-12-09 NOTE — Progress Notes (Signed)
Patient restless, have repositioned to attempt to make more comfortable. Family is aware of the prognosis and are discussing changing the code status of the patient. Patient is now on multiple drips pressures remain soft. Very poor appetite, non-compliant with some medications and also with keeping monitoring devices and oxygen in place. Have tried to educate patient on the need for monitoring. Have answered questions for family as well as patient, will continue to monitor. Call bell in reach.

## 2015-12-09 NOTE — Progress Notes (Signed)
PROGRESS NOTE    Austin [name]  ZOX:[id number] DOB: [birth year] DOA: 12/04/2015 PCP: Aida Puffer, MD   Brief Narrative:  Pt is a 45 y/o male with alcohol abuse and previous history of atrial fibrillation presents to the ER because of increasing lower extremity edema over the last couple weeks.  Hospital stay complicated by hypotension and A. fib with RVR  Assessment & Plan:   Principal Problem:   Anasarca - currently on pressors and diuretic, cardiology managing.  Hypotension - Critical care team consulted and on board. - Complicates treating patient's Afib with RVR  Active Problems:   Atrial fibrillation with RVR [facility]) - cardiology on board and managing    Alcoholic hepatitis - Most likely causing principle problem - poor prognosis moving forward, Palliative consulted.    ARF (acute renal failure) (HCC) - Currently stable, will avoid nephrotoxic agents   DVT prophylaxis: auto anticoagulated last inr 2.16 Code Status: Full Family Communication: d/c patient directly Disposition Plan: May require palliative care if patient shows no improvement in general condition   Consultants:   Critical care  Cardiology  Palliative   Procedures: None   Antimicrobials: levaquin   Subjective: Pt has no new complaints.   Objective: Vitals:   12/09/15 0525 12/09/15 0600 12/09/15 0700 12/09/15 0800  BP: 96/61 95/72 (!) 115/100 (!) 86/67  Pulse: (!) 103 (!) 105 (!) 101 (!) 102  Resp: Temp: 97.9 F (36.6 C)  97.3 F (36.3 C)   TempSrc: Oral  Oral   SpO2: 91% 98% 97% 96%  Weight:      Height:        Intake/Output Summary (Last 24 hours) at 12/09/15 1033 Last data filed at 12/09/15 0800  Gross per 24 hour  Intake           370.12 ml  Output             1051 ml  Net          -680.88 ml   Filed Weights   12/06/15 0525 12/07/15 0300 12/09/15 0324  Weight: 92 kg (202 lb 13.2 oz) 92.8 kg (204 lb 9.4 oz) 93.3 kg (205 lb 11 oz)     Examination:  General exam: Appears calm and comfortable, in nad. Respiratory system: Clear to auscultation. Respiratory effort normal. Cardiovascular system: S1 & S2 heard, RRR. No JVD, murmurs, rubs, gallops or clicks. No pedal edema. Gastrointestinal system: Abdomen is + distended, soft and nontender. No organomegaly or masses felt. Normal bowel sounds heard. Central nervous system: Awake and alert, no facial asymmetry, answers questions appropriately  Extremities: Symmetric 5 x 5 power. Skin: No rashes, lesions or ulcers Psychiatry: Judgement and insight appear normal. Mood & affect appropriate.   Data Reviewed: I have personally reviewed following labs and imaging studies  CBC:  Recent Labs Lab 12/04/15 1355 12/05/15 0718 12/06/15 1050 12/08/15 0758  WBC 10.3 9.0 8.4 8.9  NEUTROABS  --   --  6.4 6.5  HGB 14.6 14.5 14.6 15.7  HCT 41.2 40.5 40.7 43.3  MCV 113.2* 110.7* 112.7* 111.6*  PLT 134* 121* 107* 86*   Basic Metabolic Panel:  Recent Labs Lab 12/04/15 1355 12/04/15 2337 12/05/15 0718 12/06/15 1050 12/08/15 0758 12/09/15 0842  NA 125*  --  126*  125* 125* 125* 123*  K 3.5  --  3.8  3.9 3.5 3.6 3.1*  CL 78*  --  78*  78* 80* 78* 77*  CO2 24  --  33*  32 31 27 33*  GLUCOSE 116*  --  104*  104* 126* 104* 119*  BUN 49*  --  50*  51* 56* 67* 64*  CREATININE 2.61*  --  2.64*  2.58* 2.60* 3.14* 3.07*  CALCIUM 8.5*  --  8.7*  8.7* 8.5* 9.0 8.7*  MG  --  1.7 1.7  --  1.9  --   PHOS  --   --  4.2  --  5.1*  --    GFR: Estimated Creatinine Clearance: 35.6 mL/min (by C-G formula based on SCr of 3.07 mg/dL). Liver Function Tests:  Recent Labs Lab 12/04/15 1355 12/05/15 0718 12/06/15 1050 12/08/15 0758  AST 57* 51* 48*  --   ALT 27 24 19   --   ALKPHOS 99 98 91  --   BILITOT 12.2* 9.7* 8.6*  --   PROT 6.5 5.9* 5.6*  --   ALBUMIN 3.0* 2.8* 2.7* 2.8*    Recent Labs Lab 12/04/15 1355  LIPASE 18    Recent Labs Lab 12/09/15 0428  AMMONIA 26    Coagulation Profile:  Recent Labs Lab 12/04/15 2137 12/08/15 0758  INR 1.99 2.16   Cardiac Enzymes:  Recent Labs Lab 12/04/15 2337 12/05/15 0718 12/05/15 1026  TROPONINI 0.16* 0.14* 0.18*   BNP (last 3 results) No results for input(s): PROBNP in the last 8760 hours. HbA1C: No results for input(s): HGBA1C in the last 72 hours. CBG: No results for input(s): GLUCAP in the last 168 hours. Lipid Profile: No results for input(s): CHOL, HDL, LDLCALC, TRIG, CHOLHDL, LDLDIRECT in the last 72 hours. Thyroid Function Tests: No results for input(s): TSH, T4TOTAL, FREET4, T3FREE, THYROIDAB in the last 72 hours. Anemia Panel: No results for input(s): VITAMINB12, FOLATE, FERRITIN, TIBC, IRON, RETICCTPCT in the last 72 hours. Sepsis Labs:  Recent Labs Lab 12/05/15 0718 12/05/15 0943 12/07/15 0707 12/08/15 0758 12/09/15 0427 12/09/15 0842  PROCALCITON 1.48  --  0.89  --  1.35  --   LATICACIDVEN 3.7* 3.4*  --  3.8*  --  2.1*    Recent Results (from the past 240 hour(s))  Culture, blood (Routine X 2) w Reflex to ID Panel     Status: None (Preliminary result)   Collection Time: 12/04/15  9:32 PM  Result Value Ref Range Status   Specimen Description BLOOD LEFT ANTECUBITAL  Final   Special Requests BOTTLES DRAWN AEROBIC AND ANAEROBIC 5CC  Final   Culture NO GROWTH 4 DAYS  Final   Report Status PENDING  Incomplete  Culture, blood (Routine X 2) w Reflex to ID Panel     Status: None (Preliminary result)   Collection Time: 12/04/15  9:34 PM  Result Value Ref Range Status   Specimen Description BLOOD RIGHT HAND  Final   Special Requests IN PEDIATRIC BOTTLE 2CC  Final   Culture NO GROWTH 4 DAYS  Final   Report Status PENDING  Incomplete  Culture, Urine     Status: None   Collection Time: 12/05/15  6:58 AM  Result Value Ref Range Status   Specimen Description URINE, RANDOM  Final   Special Requests NONE  Final   Culture NO GROWTH  Final   Report Status 12/06/2015 FINAL  Final   MRSA PCR Screening     Status: None   Collection Time: 12/05/15 11:38 AM  Result Value Ref Range Status   MRSA by PCR NEGATIVE NEGATIVE Final    Comment:        The GeneXpert MRSA Assay (FDA  approved for NASAL specimens only), is one component of a comprehensive MRSA colonization surveillance program. It is not intended to diagnose MRSA infection nor to guide or monitor treatment for MRSA infections.          Radiology Studies: Ct Head Wo Contrast  Result Date: 12/07/2015 CLINICAL DATA:  Fall.  History of atrial fibrillation. EXAM: CT HEAD WITHOUT CONTRAST TECHNIQUE: Contiguous axial images were obtained from the base of the skull through the vertex without intravenous contrast. COMPARISON:  Head CT dated 05/05/2010. FINDINGS: Brain: There is generalized parenchymal atrophy, moderate in degree for age, with commensurate dilatation of the ventricles and sulci. There is no mass, hemorrhage, edema or other evidence of acute parenchymal abnormality. No extra-axial hemorrhage. Vascular: No hyperdense vessel or unexpected calcification. Skull: Negative for fracture or focal lesion. Sinuses/Orbits: No acute findings. Other: None. IMPRESSION: 1. No acute findings.  No intracranial mass, hemorrhage or edema. 2. Atrophy. Electronically Signed   By: Bary Richard M.D.   On: 12/07/2015 21:06   Dg Chest Port 1 View  Addendum Date: 12/08/2015   ADDENDUM REPORT: 12/08/2015 20:03 ADDENDUM: The PICC line terminates in the central SVC. Electronically Signed   By: Gerome Sam III M.D   On: 12/08/2015 20:03   Result Date: 12/08/2015 CLINICAL DATA:  Ongoing alcohol abuse. History of renal and liver failure. EXAM: PORTABLE CHEST 1 VIEW COMPARISON:  December 05, 2015 FINDINGS: No pneumothorax. Diffuse bilateral interstitial opacities consistent with edema. More focal opacity in the right base. Mild cardiomegaly. No other acute abnormalities. IMPRESSION: 1. Pulmonary edema. 2. More focal opacity in the right  base could represent superimposed developing infiltrate or asymmetric edema. Recommend clinical correlation and follow-up to resolution. Electronically Signed: By: Gerome Sam III M.D On: 12/08/2015 15:54        Scheduled Meds: . amiodarone  200 mg Oral BID  . folic acid  1 mg Oral Daily  . furosemide  80 mg Intravenous BID  . levofloxacin  750 mg Oral Q48H  . multivitamin with minerals  1 tablet Oral Daily  . protein supplement  2 oz Oral QID  . sodium chloride flush  10-40 mL Intracatheter Q12H  . thiamine  100 mg Oral Daily   Or  . thiamine  100 mg Intravenous Daily   Continuous Infusions: . DOBUTamine 4 mcg/kg/min (12/09/15 0800)     LOS: 5 days    Time spent: > 35 minutes  Penny Pia, MD [facility] Pager [phone]  If 7PM-7AM, please contact night-coverage [email or web address] Password [email or web address] 12/09/2015, 10:33 AM

## 2015-12-10 DIAGNOSIS — R609 Edema, unspecified: Secondary | ICD-10-CM

## 2015-12-10 DIAGNOSIS — K766 Portal hypertension: Secondary | ICD-10-CM

## 2015-12-10 DIAGNOSIS — E872 Acidosis, unspecified: Secondary | ICD-10-CM

## 2015-12-10 DIAGNOSIS — K709 Alcoholic liver disease, unspecified: Secondary | ICD-10-CM

## 2015-12-10 DIAGNOSIS — N184 Chronic kidney disease, stage 4 (severe): Secondary | ICD-10-CM

## 2015-12-10 DIAGNOSIS — I4891 Unspecified atrial fibrillation: Secondary | ICD-10-CM

## 2015-12-10 DIAGNOSIS — D689 Coagulation defect, unspecified: Secondary | ICD-10-CM

## 2015-12-10 DIAGNOSIS — F411 Generalized anxiety disorder: Secondary | ICD-10-CM

## 2015-12-10 DIAGNOSIS — N179 Acute kidney failure, unspecified: Secondary | ICD-10-CM

## 2015-12-10 DIAGNOSIS — F1010 Alcohol abuse, uncomplicated: Secondary | ICD-10-CM

## 2015-12-10 DIAGNOSIS — I5023 Acute on chronic systolic (congestive) heart failure: Secondary | ICD-10-CM

## 2015-12-10 DIAGNOSIS — Z7189 Other specified counseling: Secondary | ICD-10-CM

## 2015-12-10 DIAGNOSIS — J9601 Acute respiratory failure with hypoxia: Secondary | ICD-10-CM

## 2015-12-10 DIAGNOSIS — R7989 Other specified abnormal findings of blood chemistry: Secondary | ICD-10-CM

## 2015-12-10 DIAGNOSIS — E871 Hypo-osmolality and hyponatremia: Secondary | ICD-10-CM

## 2015-12-10 LAB — CBC WITH DIFFERENTIAL/PLATELET
Basophils Absolute: 0 10*3/uL (ref 0.0–0.1)
Basophils Relative: 0 %
EOS ABS: 0 10*3/uL (ref 0.0–0.7)
Eosinophils Relative: 0 %
HEMATOCRIT: 39.3 % (ref 39.0–52.0)
HEMOGLOBIN: 14.4 g/dL (ref 13.0–17.0)
LYMPHS ABS: 1 10*3/uL (ref 0.7–4.0)
LYMPHS PCT: 11 %
MCH: 39.6 pg — AB (ref 26.0–34.0)
MCHC: 36.6 g/dL — AB (ref 30.0–36.0)
MCV: 108 fL — AB (ref 78.0–100.0)
MONOS PCT: 9 %
Monocytes Absolute: 0.8 10*3/uL (ref 0.1–1.0)
NEUTROS ABS: 7.4 10*3/uL (ref 1.7–7.7)
NEUTROS PCT: 80 %
Platelets: 57 10*3/uL — ABNORMAL LOW (ref 150–400)
RBC: 3.64 MIL/uL — AB (ref 4.22–5.81)
RDW: 16.1 % — ABNORMAL HIGH (ref 11.5–15.5)
WBC: 9.2 10*3/uL (ref 4.0–10.5)

## 2015-12-10 LAB — BASIC METABOLIC PANEL
Anion gap: 15 (ref 5–15)
BUN: 65 mg/dL — AB (ref 6–20)
CO2: 30 mmol/L (ref 22–32)
CREATININE: 3.39 mg/dL — AB (ref 0.61–1.24)
Calcium: 8.7 mg/dL — ABNORMAL LOW (ref 8.9–10.3)
Chloride: 76 mmol/L — ABNORMAL LOW (ref 101–111)
GFR calc Af Amer: 23 mL/min — ABNORMAL LOW (ref >60)
GFR, EST NON AFRICAN AMERICAN: 20 mL/min — AB (ref >60)
GLUCOSE: 129 mg/dL — AB (ref 65–99)
Potassium: 3.9 mmol/L (ref 3.5–5.1)
SODIUM: 121 mmol/L — AB (ref 135–145)

## 2015-12-10 LAB — CARBOXYHEMOGLOBIN
CARBOXYHEMOGLOBIN: 0.9 % (ref 0.5–1.5)
CARBOXYHEMOGLOBIN: 0.8 % (ref 0.5–1.5)
METHEMOGLOBIN: 0.8 % (ref 0.0–1.5)
METHEMOGLOBIN: 0.8 % (ref 0.0–1.5)
O2 SAT: 43.5 %
O2 Saturation: 43.5 %
TOTAL HEMOGLOBIN: 14.4 g/dL (ref 13.5–18.0)
Total hemoglobin: 14.4 g/dL (ref 13.5–18.0)

## 2015-12-10 LAB — COMPREHENSIVE METABOLIC PANEL
ALBUMIN: 2.6 g/dL — AB (ref 3.5–5.0)
ALT: 20 U/L (ref 17–63)
AST: 60 U/L — AB (ref 15–41)
Alkaline Phosphatase: 84 U/L (ref 38–126)
Anion gap: 16 — ABNORMAL HIGH (ref 5–15)
BUN: 66 mg/dL — AB (ref 6–20)
CALCIUM: 8.5 mg/dL — AB (ref 8.9–10.3)
CO2: 30 mmol/L (ref 22–32)
Chloride: 74 mmol/L — ABNORMAL LOW (ref 101–111)
Creatinine, Ser: 3.5 mg/dL — ABNORMAL HIGH (ref 0.61–1.24)
GFR calc Af Amer: 22 mL/min — ABNORMAL LOW (ref >60)
GFR calc non Af Amer: 19 mL/min — ABNORMAL LOW (ref >60)
GLUCOSE: 215 mg/dL — AB (ref 65–99)
Potassium: 4.1 mmol/L (ref 3.5–5.1)
SODIUM: 120 mmol/L — AB (ref 135–145)
TOTAL PROTEIN: 5.4 g/dL — AB (ref 6.5–8.1)
Total Bilirubin: 8.8 mg/dL — ABNORMAL HIGH (ref 0.3–1.2)

## 2015-12-10 LAB — PROTIME-INR
INR: 2.08
Prothrombin Time: 23.7 seconds — ABNORMAL HIGH (ref 11.4–15.2)

## 2015-12-10 LAB — LACTIC ACID, PLASMA: Lactic Acid, Venous: 3.3 mmol/L (ref 0.5–1.9)

## 2015-12-10 MED ORDER — METOLAZONE 2.5 MG PO TABS
2.5000 mg | ORAL_TABLET | Freq: Once | ORAL | Status: AC
  Administered 2015-12-10: 2.5 mg via ORAL
  Filled 2015-12-10: qty 1

## 2015-12-10 MED ORDER — GI COCKTAIL ~~LOC~~
30.0000 mL | Freq: Once | ORAL | Status: AC
  Administered 2015-12-10: 30 mL via ORAL
  Filled 2015-12-10: qty 30

## 2015-12-10 MED ORDER — LORAZEPAM 2 MG/ML IJ SOLN
0.5000 mg | INTRAMUSCULAR | Status: DC | PRN
  Administered 2015-12-10 – 2015-12-16 (×3): 0.5 mg via INTRAVENOUS
  Filled 2015-12-10 (×4): qty 1

## 2015-12-10 MED ORDER — OXYCODONE-ACETAMINOPHEN 5-325 MG PO TABS
1.0000 | ORAL_TABLET | Freq: Four times a day (QID) | ORAL | Status: DC | PRN
  Administered 2015-12-10 – 2015-12-11 (×4): 2 via ORAL
  Filled 2015-12-10 (×4): qty 2

## 2015-12-10 MED ORDER — DOBUTAMINE IN D5W 4-5 MG/ML-% IV SOLN
5.0000 ug/kg/min | INTRAVENOUS | Status: DC
  Administered 2015-12-10 – 2015-12-13 (×3): 5 ug/kg/min via INTRAVENOUS
  Filled 2015-12-10 (×3): qty 250

## 2015-12-10 NOTE — Progress Notes (Signed)
   12/10/15 1000  Clinical Encounter Type  Visited With Family  Visit Type Follow-up  Referral From Nurse  Consult/Referral To Chaplain  Spiritual Encounters  Spiritual Needs Other (Comment);Emotional (AD)  Stress Factors  Patient Stress Factors Loss of control  Family Stress Factors Loss  [facility].  Paperwork is almost complete. CHP reviewed with brother.  When patient wakes up brother will work with patient to complete paperwork.  CHP will return at 1:30 to check paperwork and with notary and witnesses. Rodney BoozeGail L Aerabella Galasso 12/10/15

## 2015-12-10 NOTE — Progress Notes (Signed)
No urine output since 7 am , Md made aware, no order given . Cont. To monitor.

## 2015-12-10 NOTE — Progress Notes (Addendum)
Patient Name: Austin [name] Date of Encounter: 12/10/2015  45 yo man with ongoing alcohol abuse, systolic HF and history of AF treated admitted with HF, renal failure and liver failure.   SUBJECTIVE Palliative Care consulted yesterday. Remains full code.  On Dobutamine 4 mcg. Todays CO-OX is down to 44%. Creatinine trending up. Weight trending up.   Echo shows moderate LV dysfunction - EF 35-40%. RV severely HK  Moderate PHTN Moderate - severe MR   CURRENT MEDS . folic acid  1 mg Oral Daily  . levofloxacin  750 mg Oral Q48H  . multivitamin with minerals  1 tablet Oral Daily  . protein supplement  2 oz Oral QID  . sodium chloride flush  10-40 mL Intracatheter Q12H  . spironolactone  25 mg Oral Daily  . thiamine  100 mg Oral Daily   Or  . thiamine  100 mg Intravenous Daily   Dobutamine 4 mcg  OBJECTIVE  Vitals:   12/09/15 2041 12/09/15 2347 12/10/15 0000 12/10/15 0411  BP: (!) 82/68 (!) 85/46 (!) 83/69 (!) 77/60  Pulse: 97 99 94 99  Resp: (!) 21 (!) 185 14 (!) 24  Temp: (!) 96.5 F (35.8 C) (!) 96.1 F (35.6 C)  97.2 F (36.2 C)  TempSrc: Axillary Axillary  Axillary  SpO2:  100% 99% 96%  Weight:    209 lb 8 oz (95 kg)  Height:        Intake/Output Summary (Last 24 hours) at 12/10/15 0731 Last data filed at 12/10/15 0600  Gross per 24 hour  Intake          1253.48 ml  Output              550 ml  Net           703.48 ml   Filed Weights   12/07/15 0300 12/09/15 0324 12/10/15 0411  Weight: 204 lb 9.4 oz (92.8 kg) 205 lb 11 oz (93.3 kg) 209 lb 8 oz (95 kg)    PHYSICAL EXAM CVP 19 General: ill appearing male.  Mild dyspnea when talking Neuro: Alert and oriented X 3. Moves all extremities spontaneously. Psych: Normal affect. HEENT:  scleral icterus.Temporal wasting  Neck: Supple without bruits. JVP to jaw  Lungs:  Clear dull at basese Heart: PMI laterally displaced irrg  +s3 3/6 systolic murmur radiating to the axilla and ULSB.  Abdomen: Soft, non-tender,  +distended, BS + x 4.  Extremities: No clubbing, cyanosis. 3+ BLE  Edema  Ted hose in place.  DP/PT/Radials 2+ and equal bilaterally.  EKG: A fib 90s   Accessory Clinical Findings  CBC  Recent Labs  12/08/15 0758 12/10/15 0355  WBC 8.9 9.2  NEUTROABS 6.5 7.4  HGB 15.7 14.4  HCT 43.3 39.3  MCV 111.6* 108.0*  PLT 86* 57*   Basic Metabolic Panel  Recent Labs  12/08/15 0758 12/09/15 0842 12/10/15 0355  NA 125* 123* 121*  K 3.6 3.1* 3.9  CL 78* 77* 76*  CO2 27 33* 30  GLUCOSE 104* 119* 129*  BUN 67* 64* 65*  CREATININE 3.14* 3.07* 3.39*  CALCIUM 9.0 8.7* 8.7*  MG 1.9  --   --   PHOS 5.1*  --   --    Liver Function Tests  Recent Labs  12/08/15 0758  ALBUMIN 2.8*   No results for input(s): LIPASE, AMYLASE in the last 72 hours. Cardiac Enzymes No results for input(s): CKTOTAL, CKMB, CKMBINDEX, TROPONINI in the last 72 hours. BNP Invalid  input(s): POCBNP D-Dimer No results for input(s): DDIMER in the last 72 hours. Hemoglobin A1C No results for input(s): HGBA1C in the last 72 hours. Fasting Lipid Panel No results for input(s): CHOL, HDL, LDLCALC, TRIG, CHOLHDL, LDLDIRECT in the last 72 hours. Thyroid Function Tests No results for input(s): TSH, T4TOTAL, T3FREE, THYROIDAB in the last 72 hours.  Invalid input(s): FREET3  TELE  afib at rate of 110-120s  Radiology/Studies  Ct Abdomen Pelvis Wo Contrast  Result Date: 12/04/2015 CLINICAL DATA:  45 year old male with increased lower extremity swelling and abdominal pain. Jaundice. Initial encounter. EXAM: CT ABDOMEN AND PELVIS WITHOUT CONTRAST TECHNIQUE: Multidetector CT imaging of the abdomen and pelvis was performed following the standard protocol without IV contrast. COMPARISON:  CT chest abdomen and pelvis 05/05/2010. FINDINGS: New small to moderate bilateral layering pleural effusions with compressive lower lobe atelectasis. Cardiomegaly appears new since 2012. No pericardial effusion. No acute osseous  abnormality identified. Anasarca, with diffuse abdominal wall and dependent flank subcutaneous stranding. Moderate volume simple appearing free fluid in the pelvis. Small volume free fluid throughout the abdomen, including perihepatic and perisplenic fluid. Negative rectum and urinary bladder. Occasional sigmoid diverticula with no active inflammation. Decompressed left colon. Mild wall thickening of the transverse colon which might be reactive. More normal appearance of the right colon. Negative appendix. Decompressed terminal ileum. Oral contrast in the small bowel, but has not yet reached the TI. Small volume of oral contrast in the distal esophagus. Negative stomach. Decompressed duodenum. Mild hepatomegaly which is new since 2012. No discrete liver lesion identified in the absence of contrast. No intrahepatic or extrahepatic biliary ductal dilatation is evident on this noncontrast exam. Increased density within the gallbladder might be sludge. The main portal vein does not appear enlarged. Negative noncontrast spleen, no splenomegaly. Negative noncontrast pancreas and adrenal glands. Negative noncontrast kidneys. Aortoiliac calcified atherosclerosis noted. No lymphadenopathy identified. IMPRESSION: 1. Anasarca with abdominal and pelvic ascites and layering pleural effusions (small to moderate volume). 2. Cardiomegaly and hepatomegaly are new since 2012. No discrete liver lesion in the absence of contrast. No findings to suggest chronic portal hypertension. The top differential considerations in this clinical setting include heart failure with congestive hepatopathy versus acute or or chronic hepatitis. 3. Calcified aortic atherosclerosis. 4. Suggestion of gallbladder sludge. Electronically Signed   By: Genevie Ann M.D.   On: 12/04/2015 20:49   Ct Head Wo Contrast  Result Date: 12/07/2015 CLINICAL DATA:  Fall.  History of atrial fibrillation. EXAM: CT HEAD WITHOUT CONTRAST TECHNIQUE: Contiguous axial images were  obtained from the base of the skull through the vertex without intravenous contrast. COMPARISON:  Head CT dated 05/05/2010. FINDINGS: Brain: There is generalized parenchymal atrophy, moderate in degree for age, with commensurate dilatation of the ventricles and sulci. There is no mass, hemorrhage, edema or other evidence of acute parenchymal abnormality. No extra-axial hemorrhage. Vascular: No hyperdense vessel or unexpected calcification. Skull: Negative for fracture or focal lesion. Sinuses/Orbits: No acute findings. Other: None. IMPRESSION: 1. No acute findings.  No intracranial mass, hemorrhage or edema. 2. Atrophy. Electronically Signed   By: Franki Cabot M.D.   On: 12/07/2015 21:06   US Abdomen Complete  Result Date: 12/05/2015 CLINICAL DATA:  Patient with elevated LFTs. Anasarca. Alcohol abuse. EXAM: ABDOMEN ULTRASOUND COMPLETE COMPARISON:  CT abdomen pelvis 12/04/2015 FINDINGS: Gallbladder: Sludge within the gallbladder lumen. No gallbladder wall thickening or pericholecystic fluid. Negative sonographic Murphy's sign. Common bile duct: Diameter: 5 mm Liver: Diffusely increased in echogenicity. No focal lesion identified.  IVC: No abnormality visualized. Pancreas: Visualized portion unremarkable. Spleen: Size and appearance within normal limits. Right Kidney: Length: 12.0 cm. Echogenicity within normal limits. No mass or hydronephrosis visualized. Left Kidney: Length: 12.4 cm. Echogenicity within normal limits. No mass or hydronephrosis visualized. Abdominal aorta: No aneurysm visualized. Other findings: Diffuse ascites.  Bilateral pleural effusions. IMPRESSION: Gallbladder sludge. No secondary signs to suggest acute cholecystitis. Hepatic steatosis. Ascites. Bilateral pleural effusions. Electronically Signed   By: Lovey Newcomer M.D.   On: 12/05/2015 17:16   Dg Chest Port 1 View  Addendum Date: 12/08/2015   ADDENDUM REPORT: 12/08/2015 20:03 ADDENDUM: The PICC line terminates in the central SVC.  Electronically Signed   By: Dorise Bullion III M.D   On: 12/08/2015 20:03   Result Date: 12/08/2015 CLINICAL DATA:  Ongoing alcohol abuse. History of renal and liver failure. EXAM: PORTABLE CHEST 1 VIEW COMPARISON:  December 05, 2015 FINDINGS: No pneumothorax. Diffuse bilateral interstitial opacities consistent with edema. More focal opacity in the right base. Mild cardiomegaly. No other acute abnormalities. IMPRESSION: 1. Pulmonary edema. 2. More focal opacity in the right base could represent superimposed developing infiltrate or asymmetric edema. Recommend clinical correlation and follow-up to resolution. Electronically Signed: By: Dorise Bullion III M.D On: 12/08/2015 15:54   Dg Chest Port 1 View  Result Date: 12/05/2015 CLINICAL DATA:  45 year old male with cough and anasarca EXAM: PORTABLE CHEST 1 VIEW COMPARISON:  CT dated 05/05/2010 FINDINGS: There is mild cardiomegaly with increased vascular and interstitial prominence compatible with congestive changes and mild interstitial edema. Superimposed pneumonia is not excluded. No focal consolidation, pleural effusion, or pneumothorax. No acute osseous pathology. IMPRESSION: Mild cardiomegaly and congestive changes.  No focal consolidation. Electronically Signed   By: Anner Crete M.D.   On: 12/05/2015 00:39    ASSESSMENT AND PLAN   1. Shock with multisystem organ failure 2. Paroxysmal Afib with RVR - Hx of PAF. Self discontinued warfarin about 2 years ago due to lack of insurance as he lost his job.  3. Acute on chronic systolic HF with biventricular failure - EF 35-40% 4. Acute on chronic renal failure, stage IV 5. Hepatic failure with anasarca 6. ETOH abuse 7. Hyponatremia  Persistent cardiogenic shock with multi-system organ failure. Todays CO-OX is 44%. Increase dobutamine to 5 mcg. Repeat CO-OX in a couple of hours. Volume status trending up. Continue lasix drip 15 mg per hour. Give 2.5 mg metolazone once. Creatinine trending up.    Continue IV amio 30 mg per hour. Not good candidate for long-term amio of AC due to ETOH cirrhosis and liver failure. Platelets down to 57.   Met with Palliative Care and he is requesting full code.   Amy Clegg NP-C  7:31 AM  Patient seen and examined with Darrick Grinder, NP. We discussed all aspects of the encounter. I agree with the assessment and plan as stated above.   Patient has worsening shock and multi-system organ failure despite inotropic support. Agree with increasing dobutamine but would not escalate care further. There is unfortunately little else we can do for him at this point. I have re-iterated the need for switching to Hospice but he still remains resistant. Will speak with family later today.  The patient is critically ill with multiple organ systems failure and requires high complexity decision making for assessment and support, frequent evaluation and titration of therapies, application of advanced monitoring technologies and extensive interpretation of multiple databases.   Critical Care Time devoted to patient care services described in  this note is 35 Minutes.  Bensimhon, Daniel,MD 8:16 AM

## 2015-12-10 NOTE — Progress Notes (Addendum)
PROGRESS NOTE  Austin [name] ZOX:[id number] DOB: [birth year] DOA: 12/04/2015 PCP: Aida Puffer, MD  Brief History:  45 year old male with a history of ongoing alcohol abuse, atrial fibrillation presented to the emergency department on 12/04/2015 with increasing lower extremity edema over several weeks and lower abdominal discomfort. The patient was found to have atrial fibrillation with RVR with initial hypotension with systolic blood pressure in the 80s. The patient was clinically fluid overloaded with anasarca and pulmonary edema. He was ultimately started on an amiodarone and lasix drip.  Critical care medicine was consulted. Cardiology was also consulted. Echocardiogram showed EF 35-40% with diffuse hypokinesis, moderate RV dilation and PAP 45. The patient was noted to have elevated procalcitonin and lactic acid and concerns for right lower lobe infiltrate. He was started on levofloxacin on 12/05/2015. Blood cultures and urine cultures have been negative. MRSA screen was negative. The patient remains critically ill with evidence of cardiac, hepatic, renal, and hematologic failure.  Unfortunately, Patient has very poor insight into his clinical situation.  Palliative care medicine was consulted, and the patient and family continue to desire the full scope of care.  Assessment/Plan: Acute respiratory failure with hypoxia -Secondary to pulmonary edema -Continue bronchodilators (xopenex) -Wean oxygen for saturation greater than 92% -stable on 3L [place]  Paroxysmal atrial fibrillation with RVR -Continue amiodarone drip -Not a candidate for anticoagulation secondary to ongoing alcohol use and cirrhosis -Self discontinued warfarin about 2 years ago due to lack of insurance as he lost his job.   Acute on chronic systolic CHF -Patient has evidence of right and left heart failure -12/05/2015 echo EF 35-40 percent, diffuse HK, moderate RV dilatation, PAP 45 -Continue furosemide drip per  cardiology--poor UO -NEG 1.5 L in past 48 hours -Amio and Dobutamine per cardiology--will need to tolerate a degree of hypotension -may need to consider consulting renal for UF if still pursing full scope of care  Portal hypertension with suspected underlying cirrhosis -Suspect underlying cirrhosis with initial presenting bilirubin 12.2 -Secondary to alcohol in setting of chronic congestion secondary to heart failure  -Patient has evidence of ascites and anasarca  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 -Previous baseline creatinine 1.0-1.1 -Secondary to hemodynamic changes   HYPOTENSION -multifactorial including third-spacing, amiodarone drip, dobutamine drip, diuresis, biventricular failure -WILL NEED TO TOLERATE SOME DEGREE OF HYPOTENSION IN HIS CLINICAL SITUATION  Coagulopathy -secondary to chronic liver disease -12/08/15 INR 2.16  Hyponatremia -Secondary to CHF and live and that has not r cirrhosis -Continue diuresis  Lactic acidosis -In part due to poor renal clearance and hepatic clearance -Monitor clinically -d/c levofloxacin after today's dose  Goals of Care -Seen by palliative medicine-->full scope of care -FULL CODE -poor insight into severity of his illness -overall poor prognosis   Disposition Plan:   Remain in stepdown  Family Communication:   Family at bedside--Total time spent 40 minutes.  Greater than 50% spent face to face counseling and coordinating care.  Medical records reviewed and summarized  Consultants:  PCCM, cardiology, Palliative medicine  Code Status:  FULL   DVT Prophylaxis:  Auto-anticoagulated   Procedures: As Listed in Progress Note Above  Antibiotics: None    Subjective: Patient has very poor insight into his clinical situation.  Currently denies cp, sob, n/v/d, abdominal pain, f/ c, dysuria, hematuria, hematochezia, melena, HA. Rash.  Objective: Vitals:   12/09/15 2041 12/09/15 2347 12/10/15 0000 12/10/15 0411  BP: (!)  82/68 (!) 85/46 (!) 83/69 (!) 77/60  Pulse: 97 99 94 99  Resp: (!) 21 (!) 185 14 (!) 24  Temp: (!) 96.5 F (35.8 C) (!) 96.1 F (35.6 C)  97.2 F (36.2 C)  TempSrc: Axillary Axillary  Axillary  SpO2:  100% 99% 96%  Weight:    95 kg (209 lb 8 oz)  Height:        Intake/Output Summary (Last 24 hours) at 12/10/15 0734 Last data filed at 12/10/15 0600  Gross per 24 hour  Intake          1253.48 ml  Output              550 ml  Net           703.48 ml   Weight change: 1.728 kg (3 lb 13 oz) Exam:   General:  Pt is alert, follows commands appropriately, not in acute distress  HEENT: No icterus, No thrush, No neck mass, [place]/AT  Cardiovascular: IRRR, S1/S2, no rubs, no gallops  Respiratory: R>L basilar cracles, no wheeze  Abdomen: Soft/+BS, non tender, non distended, no guarding  Extremities: 2 + edema, No lymphangitis, No petechiae, No rashes, no synovitis   Data Reviewed: I have personally reviewed following labs and imaging studies Basic Metabolic Panel:  Recent Labs Lab 12/04/15 2337 12/05/15 0718 12/06/15 1050 12/08/15 0758 12/09/15 0842 12/10/15 0355  NA  --  126*  125* 125* 125* 123* 121*  K  --  3.8  3.9 3.5 3.6 3.1* 3.9  CL  --  78*  78* 80* 78* 77* 76*  CO2  --  33*  32 31 27 33* 30  GLUCOSE  --  104*  104* 126* 104* 119* 129*  BUN  --  50*  51* 56* 67* 64* 65*  CREATININE  --  2.64*  2.58* 2.60* 3.14* 3.07* 3.39*  CALCIUM  --  8.7*  8.7* 8.5* 9.0 8.7* 8.7*  MG 1.7 1.7  --  1.9  --   --   PHOS  --  4.2  --  5.1*  --   --    Liver Function Tests:  Recent Labs Lab 12/04/15 1355 12/05/15 0718 12/06/15 1050 12/08/15 0758  AST 57* 51* 48*  --   ALT 27 24 19   --   ALKPHOS 99 98 91  --   BILITOT 12.2* 9.7* 8.6*  --   PROT 6.5 5.9* 5.6*  --   ALBUMIN 3.0* 2.8* 2.7* 2.8*    Recent Labs Lab 12/04/15 1355  LIPASE 18    Recent Labs Lab 12/09/15 0428  AMMONIA 26   Coagulation Profile:  Recent Labs Lab 12/04/15 2137 12/08/15 0758    INR 1.99 2.16   CBC:  Recent Labs Lab 12/04/15 1355 12/05/15 0718 12/06/15 1050 12/08/15 0758 12/10/15 0355  WBC 10.3 9.0 8.4 8.9 9.2  NEUTROABS  --   --  6.4 6.5 7.4  HGB 14.6 14.5 14.6 15.7 14.4  HCT 41.2 40.5 40.7 43.3 39.3  MCV 113.2* 110.7* 112.7* 111.6* 108.0*  PLT 134* 121* 107* 86* 57*   Cardiac Enzymes:  Recent Labs Lab 12/04/15 2337 12/05/15 0718 12/05/15 1026  TROPONINI 0.16* 0.14* 0.18*   BNP: Invalid input(s): POCBNP CBG: No results for input(s): GLUCAP in the last 168 hours. HbA1C: No results for input(s): HGBA1C in the last 72 hours. Urine analysis:    Component Value Date/Time   COLORURINE ORANGE (A) 12/04/2015 1357   APPEARANCEUR CLOUDY (A) 12/04/2015 1357   LABSPEC 1.022 12/04/2015 1357   PHURINE  5.0 12/04/2015 1357   GLUCOSEU NEGATIVE 12/04/2015 1357   HGBUR NEGATIVE 12/04/2015 1357   BILIRUBINUR LARGE (A) 12/04/2015 1357   KETONESUR 15 (A) 12/04/2015 1357   PROTEINUR 100 (A) 12/04/2015 1357   UROBILINOGEN 0.2 05/05/2010 0620   NITRITE POSITIVE (A) 12/04/2015 1357   LEUKOCYTESUR SMALL (A) 12/04/2015 1357   Sepsis Labs: @LABRCNTIP (procalcitonin:4,lacticidven:4) ) Recent Results (from the past 240 hour(s))  Culture, blood (Routine X 2) w Reflex to ID Panel     Status: None   Collection Time: 12/04/15  9:32 PM  Result Value Ref Range Status   Specimen Description BLOOD LEFT ANTECUBITAL  Final   Special Requests BOTTLES DRAWN AEROBIC AND ANAEROBIC 5CC  Final   Culture NO GROWTH 5 DAYS  Final   Report Status 12/09/2015 FINAL  Final  Culture, blood (Routine X 2) w Reflex to ID Panel     Status: None   Collection Time: 12/04/15  9:34 PM  Result Value Ref Range Status   Specimen Description BLOOD RIGHT HAND  Final   Special Requests IN PEDIATRIC BOTTLE 2CC  Final   Culture NO GROWTH 5 DAYS  Final   Report Status 12/09/2015 FINAL  Final  Culture, Urine     Status: None   Collection Time: 12/05/15  6:58 AM  Result Value Ref Range Status    Specimen Description URINE, RANDOM  Final   Special Requests NONE  Final   Culture NO GROWTH  Final   Report Status 12/06/2015 FINAL  Final  MRSA PCR Screening     Status: None   Collection Time: 12/05/15 11:38 AM  Result Value Ref Range Status   MRSA by PCR NEGATIVE NEGATIVE Final    Comment:        The GeneXpert MRSA Assay (FDA approved for NASAL specimens only), is one component of a comprehensive MRSA colonization surveillance program. It is not intended to diagnose MRSA infection nor to guide or monitor treatment for MRSA infections.      Scheduled Meds: . folic acid  1 mg Oral Daily  . levofloxacin  750 mg Oral Q48H  . multivitamin with minerals  1 tablet Oral Daily  . protein supplement  2 oz Oral QID  . sodium chloride flush  10-40 mL Intracatheter Q12H  . spironolactone  25 mg Oral Daily  . thiamine  100 mg Oral Daily   Or  . thiamine  100 mg Intravenous Daily   Continuous Infusions: . amiodarone 30 mg/hr (12/09/15 2000)  . DOBUTamine    . furosemide (LASIX) infusion 15 mg/hr (12/10/15 0725)    Procedures/Studies: Ct Abdomen Pelvis Wo Contrast  Result Date: 12/04/2015 CLINICAL DATA:  45 year old male with increased lower extremity swelling and abdominal pain. Jaundice. Initial encounter. EXAM: CT ABDOMEN AND PELVIS WITHOUT CONTRAST TECHNIQUE: Multidetector CT imaging of the abdomen and pelvis was performed following the standard protocol without IV contrast. COMPARISON:  CT chest abdomen and pelvis 05/05/2010. FINDINGS: New small to moderate bilateral layering pleural effusions with compressive lower lobe atelectasis. Cardiomegaly appears new since 2012. No pericardial effusion. No acute osseous abnormality identified. Anasarca, with diffuse abdominal wall and dependent flank subcutaneous stranding. Moderate volume simple appearing free fluid in the pelvis. Small volume free fluid throughout the abdomen, including perihepatic and perisplenic fluid. Negative rectum  and urinary bladder. Occasional sigmoid diverticula with no active inflammation. Decompressed left colon. Mild wall thickening of the transverse colon which might be reactive. More normal appearance of the right colon. Negative appendix. Decompressed terminal ileum.  Oral contrast in the small bowel, but has not yet reached the TI. Small volume of oral contrast in the distal esophagus. Negative stomach. Decompressed duodenum. Mild hepatomegaly which is new since 2012. No discrete liver lesion identified in the absence of contrast. No intrahepatic or extrahepatic biliary ductal dilatation is evident on this noncontrast exam. Increased density within the gallbladder might be sludge. The main portal vein does not appear enlarged. Negative noncontrast spleen, no splenomegaly. Negative noncontrast pancreas and adrenal glands. Negative noncontrast kidneys. Aortoiliac calcified atherosclerosis noted. No lymphadenopathy identified. IMPRESSION: 1. Anasarca with abdominal and pelvic ascites and layering pleural effusions (small to moderate volume). 2. Cardiomegaly and hepatomegaly are new since 2012. No discrete liver lesion in the absence of contrast. No findings to suggest chronic portal hypertension. The top differential considerations in this clinical setting include heart failure with congestive hepatopathy versus acute or or chronic hepatitis. 3. Calcified aortic atherosclerosis. 4. Suggestion of gallbladder sludge. Electronically Signed   By: Odessa Fleming M.D.   On: 12/04/2015 20:49   Ct Head Wo Contrast  Result Date: 12/07/2015 CLINICAL DATA:  Fall.  History of atrial fibrillation. EXAM: CT HEAD WITHOUT CONTRAST TECHNIQUE: Contiguous axial images were obtained from the base of the skull through the vertex without intravenous contrast. COMPARISON:  Head CT dated 05/05/2010. FINDINGS: Brain: There is generalized parenchymal atrophy, moderate in degree for age, with commensurate dilatation of the ventricles and sulci. There  is no mass, hemorrhage, edema or other evidence of acute parenchymal abnormality. No extra-axial hemorrhage. Vascular: No hyperdense vessel or unexpected calcification. Skull: Negative for fracture or focal lesion. Sinuses/Orbits: No acute findings. Other: None. IMPRESSION: 1. No acute findings.  No intracranial mass, hemorrhage or edema. 2. Atrophy. Electronically Signed   By: Bary Richard M.D.   On: 12/07/2015 21:06   US Abdomen Complete  Result Date: 12/05/2015 CLINICAL DATA:  Patient with elevated LFTs. Anasarca. Alcohol abuse. EXAM: ABDOMEN ULTRASOUND COMPLETE COMPARISON:  CT abdomen pelvis 12/04/2015 FINDINGS: Gallbladder: Sludge within the gallbladder lumen. No gallbladder wall thickening or pericholecystic fluid. Negative sonographic Murphy's sign. Common bile duct: Diameter: 5 mm Liver: Diffusely increased in echogenicity. No focal lesion identified. IVC: No abnormality visualized. Pancreas: Visualized portion unremarkable. Spleen: Size and appearance within normal limits. Right Kidney: Length: 12.0 cm. Echogenicity within normal limits. No mass or hydronephrosis visualized. Left Kidney: Length: 12.4 cm. Echogenicity within normal limits. No mass or hydronephrosis visualized. Abdominal aorta: No aneurysm visualized. Other findings: Diffuse ascites.  Bilateral pleural effusions. IMPRESSION: Gallbladder sludge. No secondary signs to suggest acute cholecystitis. Hepatic steatosis. Ascites. Bilateral pleural effusions. Electronically Signed   By: Annia Belt M.D.   On: 12/05/2015 17:16   Dg Chest Port 1 View  Addendum Date: 12/08/2015   ADDENDUM REPORT: 12/08/2015 20:03 ADDENDUM: The PICC line terminates in the central SVC. Electronically Signed   By: Gerome Sam III M.D   On: 12/08/2015 20:03   Result Date: 12/08/2015 CLINICAL DATA:  Ongoing alcohol abuse. History of renal and liver failure. EXAM: PORTABLE CHEST 1 VIEW COMPARISON:  December 05, 2015 FINDINGS: No pneumothorax. Diffuse bilateral  interstitial opacities consistent with edema. More focal opacity in the right base. Mild cardiomegaly. No other acute abnormalities. IMPRESSION: 1. Pulmonary edema. 2. More focal opacity in the right base could represent superimposed developing infiltrate or asymmetric edema. Recommend clinical correlation and follow-up to resolution. Electronically Signed: By: Gerome Sam III M.D On: 12/08/2015 15:54   Dg Chest Port 1 View  Result Date: 12/05/2015 CLINICAL DATA:  45 year old male with cough and anasarca EXAM: PORTABLE CHEST 1 VIEW COMPARISON:  CT dated 05/05/2010 FINDINGS: There is mild cardiomegaly with increased vascular and interstitial prominence compatible with congestive changes and mild interstitial edema. Superimposed pneumonia is not excluded. No focal consolidation, pleural effusion, or pneumothorax. No acute osseous pathology. IMPRESSION: Mild cardiomegaly and congestive changes.  No focal consolidation. Electronically Signed   By: Elgie Collard M.D.   On: 12/05/2015 00:39    Kameka Whan, DO  [facility] Pager [phone]  If 7PM-7AM, please contact night-coverage [email or web address] Password [email or web address] 12/10/2015, 7:34 AM   LOS: 6 days

## 2015-12-10 NOTE — Progress Notes (Addendum)
Daily Progress Note   Patient Name: Austin [name]       Date: 12/10/2015 DOB: 05-May-1971  Age: 45 y.o. MRN#: [id number] Attending Physician: Catarina Hartshorn, MD Primary Care Physician: Aida Puffer, MD Admit Date: 12/04/2015  Reason for Consultation/Follow-up: Establishing goals of care  Subjective: "I'm getting better.  I got to see my 60 yo son yesterday for the 1st time in 3 years"   "I want to get out of the hospital, I want to jump out of the window"  (Patient reports being very anxious).  We discussed Advanced Directives and HCPOA.   Austin [name] appointed his brother, Gery Pray, as his HCPOA.   Austin [name] states he would want to be resuscitated one time, but if he declined again he would want to be let go.  Also he does not want prolonged life support.  If he was not improving after a few days or a week then he would want to be let go.  Outside of the room brother, Gery Pray, asked about the patient's condition.  I explained he has liver, heart, and kidney failure and there is a chance he will not survive this hospitalization.   Length of Stay: 6  Current Medications: Scheduled Meds:  . folic acid  1 mg Oral Daily  . levofloxacin  750 mg Oral Q48H  . metolazone  2.5 mg Oral Once  . multivitamin with minerals  1 tablet Oral Daily  . protein supplement  2 oz Oral QID  . sodium chloride flush  10-40 mL Intracatheter Q12H  . spironolactone  25 mg Oral Daily  . thiamine  100 mg Oral Daily   Or  . thiamine  100 mg Intravenous Daily    Continuous Infusions: . amiodarone 30 mg/hr (12/09/15 2000)  . DOBUTamine 5 mcg/kg/min (12/10/15 0745)  . furosemide (LASIX) infusion 15 mg/hr (12/10/15 0725)    PRN Meds: levalbuterol, LORazepam, ondansetron **OR** ondansetron (ZOFRAN) IV, oxyCODONE-acetaminophen,  sodium chloride flush  Physical Exam        Wd 45 yo male, pallor, appears older than stated age. CV difficult to hear, rrr resp CTA no increased work of breathing. Abdomen + Hepatomegaly, NT, +BS Extremities +Ted hose, 2 +edema L>R   Vital Signs: BP 92/74 (BP Location: Left Arm)   Pulse 98   Temp (!) 96.1 F (35.6 C) (Axillary)  Resp (!) 24   Ht  (1.905 m)   Wt 95 kg (209 lb 8 oz)   SpO2 98%   BMI 26.19 kg/m  SpO2: SpO2: 98 % O2 Device: O2 Device: Nasal Cannula O2 Flow Rate: O2 Flow Rate (L/min): 3 L/min  Intake/output summary:  Intake/Output Summary (Last 24 hours) at 12/10/15 0950 Last data filed at 12/10/15 0800  Gross per 24 hour  Intake          1089.58 ml  Output              350 ml  Net           739.58 ml   LBM: Last BM Date: 12/08/15 Baseline Weight: Weight: 92.1 kg (203 lb 0.7 oz) Most recent weight: Weight: 95 kg (209 lb 8 oz)       Palliative Assessment/Data:    Flowsheet Rows   Flowsheet Row Most Recent Value  Intake Tab  Referral Department  Hospitalist  Unit at Time of Referral  ICU  Palliative Care Primary Diagnosis  Cardiac  Date Notified  12/08/15  Palliative Care Type  New Palliative care  Reason for referral  Clarify Goals of Care, Psychosocial or Spiritual support  Date of Admission  12/04/15  Date first seen by Palliative Care  12/09/15  # of days Palliative referral response time  1 Day(s)  # of days IP prior to Palliative referral  4  Clinical Assessment  Palliative Performance Scale Score  30%  Pain Max last 24 hours  Not able to report  Pain Min Last 24 hours  Not able to report  Dyspnea Max Last 24 Hours  Not able to report  Dyspnea Min Last 24 hours  Not able to report  Nausea Max Last 24 Hours  Not able to report  Nausea Min Last 24 Hours  Not able to report  Anxiety Max Last 24 Hours  Not able to report  Anxiety Min Last 24 Hours  Not able to report  Other Max Last 24 Hours  Not able to report  Psychosocial &  Spiritual Assessment  Palliative Care Outcomes  Patient/Family meeting held?  Yes  Who was at the meeting?  parents  Palliative Care follow-up planned  Yes, Facility      Patient Active Problem List   Diagnosis Date Noted  . Acute on chronic systolic CHF (congestive heart failure) (HCC) 12/10/2015  . Acute respiratory failure with hypoxia (HCC) 12/10/2015  . Acute renal failure superimposed on stage 4 chronic kidney disease (HCC) 12/10/2015  . Portal hypertension (HCC) 12/10/2015  . Coagulopathy (HCC) 12/10/2015  . Hyponatremia 12/10/2015  . Lactic acidosis 12/10/2015  . Edema   . Alcohol abuse, unspecified   . Alcoholic liver damage, unspecified   . Atrial fibrillation (HCC)   . Other abnormal blood chemistry   . Palliative care encounter   . Cardiogenic shock (HCC)   . Jaundice   . Elevated LFTs   . Atrial fibrillation with RVR (HCC) 12/04/2015  . Anasarca 12/04/2015  . Alcoholic liver disease (HCC) 12/04/2015  . ARF (acute renal failure) (HCC) 12/04/2015  . Alcohol abuse 12/04/2015  . Acute kidney injury [facility])     Palliative Care Assessment & Plan   Patient Profile: 45 y.o. male  with past medical history of Atrial fibrillation, alcohol abuse, opioid use disorder, systolic heart failure admitted on 12/04/2015 with increasing lower extremity edema, abdominal discomfort. While in the emergency room he was found to be in atrial  fibrillation with RVR. He also was very hypotensive requiring a fluid bolus. Since admission he has become sicker and is now with multisystem failure: Worsening systolic heart function with an ejection fraction of 35%, kidney failure and now is on a dobutamine drip to maintain blood pressure. His last drink of alcohol was 2 days prior to admission  Assessment: Patient with a strong will to live.  However his liver, heart, and kidneys are failing him.  He does not want prolonged life support.  He wants his brother, [name] to be his  HCPOA.  Recommendations/Plan:  Continue current course of treatment  No prolonged Life support  [name] = HCPOA  Low dose ativan PRN for anxiety.   Code Status: FULL   Prognosis:   Uncertain but likely days to weeks.  Possibly hospital death.    Discharge Planning:  Hospice facility vs Hospital death.  Care plan was discussed with brother, [name] and father.  Thank you for allowing the Palliative Medicine Team to assist in the care of this patient.   Time In: 9:30 Time Out: 10:00 Total Time 25 Prolonged Time Billed no      Greater than 50%  of this time was spent counseling and coordinating care related to the above assessment and plan.  Algis DownsMarianne York, PA-C Palliative Medicine Pager: [phone]  Please contact Palliative Medicine Team phone at [phone] for questions and concerns.

## 2015-12-11 DIAGNOSIS — M25562 Pain in left knee: Secondary | ICD-10-CM

## 2015-12-11 DIAGNOSIS — M25561 Pain in right knee: Secondary | ICD-10-CM

## 2015-12-11 DIAGNOSIS — Z66 Do not resuscitate: Secondary | ICD-10-CM

## 2015-12-11 LAB — CARBOXYHEMOGLOBIN
Carboxyhemoglobin: 1.6 % — ABNORMAL HIGH (ref 0.5–1.5)
Methemoglobin: 0.6 % (ref 0.0–1.5)
O2 Saturation: 56.7 %
TOTAL HEMOGLOBIN: 13.9 g/dL (ref 13.5–18.0)

## 2015-12-11 LAB — CBC
HCT: 38.3 % — ABNORMAL LOW (ref 39.0–52.0)
Hemoglobin: 13.8 g/dL (ref 13.0–17.0)
MCH: 39.4 pg — AB (ref 26.0–34.0)
MCHC: 36 g/dL (ref 30.0–36.0)
MCV: 109.4 fL — ABNORMAL HIGH (ref 78.0–100.0)
PLATELETS: 56 10*3/uL — AB (ref 150–400)
RBC: 3.5 MIL/uL — ABNORMAL LOW (ref 4.22–5.81)
RDW: 16 % — AB (ref 11.5–15.5)
WBC: 8 10*3/uL (ref 4.0–10.5)

## 2015-12-11 LAB — COMPREHENSIVE METABOLIC PANEL
ALBUMIN: 2.5 g/dL — AB (ref 3.5–5.0)
ALK PHOS: 84 U/L (ref 38–126)
ALT: 22 U/L (ref 17–63)
AST: 55 U/L — ABNORMAL HIGH (ref 15–41)
Anion gap: 16 — ABNORMAL HIGH (ref 5–15)
BUN: 64 mg/dL — AB (ref 6–20)
CALCIUM: 8.5 mg/dL — AB (ref 8.9–10.3)
CO2: 32 mmol/L (ref 22–32)
CREATININE: 3.32 mg/dL — AB (ref 0.61–1.24)
Chloride: 71 mmol/L — ABNORMAL LOW (ref 101–111)
GFR calc non Af Amer: 21 mL/min — ABNORMAL LOW (ref >60)
GFR, EST AFRICAN AMERICAN: 24 mL/min — AB (ref >60)
GLUCOSE: 208 mg/dL — AB (ref 65–99)
Potassium: 3.5 mmol/L (ref 3.5–5.1)
SODIUM: 119 mmol/L — AB (ref 135–145)
Total Bilirubin: 7.9 mg/dL — ABNORMAL HIGH (ref 0.3–1.2)
Total Protein: 5.3 g/dL — ABNORMAL LOW (ref 6.5–8.1)

## 2015-12-11 LAB — PROTIME-INR
INR: 2.02
PROTHROMBIN TIME: 23.1 s — AB (ref 11.4–15.2)

## 2015-12-11 MED ORDER — POTASSIUM CHLORIDE CRYS ER 20 MEQ PO TBCR
40.0000 meq | EXTENDED_RELEASE_TABLET | Freq: Once | ORAL | Status: AC
  Administered 2015-12-11: 40 meq via ORAL
  Filled 2015-12-11: qty 2

## 2015-12-11 MED ORDER — GI COCKTAIL ~~LOC~~
30.0000 mL | Freq: Once | ORAL | Status: AC
  Administered 2015-12-11: 30 mL via ORAL

## 2015-12-11 MED ORDER — OXYCODONE-ACETAMINOPHEN 5-325 MG PO TABS
1.0000 | ORAL_TABLET | ORAL | Status: DC | PRN
  Administered 2015-12-11 – 2015-12-17 (×10): 2 via ORAL
  Filled 2015-12-11 (×10): qty 2

## 2015-12-11 MED ORDER — MORPHINE SULFATE (PF) 2 MG/ML IV SOLN
2.0000 mg | INTRAVENOUS | Status: DC | PRN
  Administered 2015-12-11: 2 mg via INTRAVENOUS
  Filled 2015-12-11: qty 1

## 2015-12-11 MED ORDER — CALCIUM CARBONATE ANTACID 500 MG PO CHEW
1.0000 | CHEWABLE_TABLET | Freq: Two times a day (BID) | ORAL | Status: DC
  Administered 2015-12-11 (×2): 200 mg via ORAL
  Filled 2015-12-11 (×2): qty 1

## 2015-12-11 MED ORDER — PANTOPRAZOLE SODIUM 40 MG PO TBEC
40.0000 mg | DELAYED_RELEASE_TABLET | Freq: Every day | ORAL | Status: DC
  Administered 2015-12-11 – 2015-12-12 (×2): 40 mg via ORAL
  Filled 2015-12-11 (×2): qty 1

## 2015-12-11 NOTE — Progress Notes (Signed)
Patient Name: Renae Fickleravis S [place] Date of Encounter: 12/11/2015  45 yo man with ongoing alcohol abuse, systolic HF and history of AF treated admitted with HF, renal failure and liver failure.   SUBJECTIVE  Palliative Care consulted yesterday. Remains full code. On Dobutamine 5 mcg. Todays CO-OX is 57%.  Weight trending up. Sluggish urine output.   Lethargic. Weak. Denies SOB.   Echo shows moderate LV dysfunction - EF 35-40%. RV severely HK  Moderate PHTN Moderate - severe MR   CURRENT MEDS . folic acid  1 mg Oral Daily  . levofloxacin  750 mg Oral Q48H  . multivitamin with minerals  1 tablet Oral Daily  . protein supplement  2 oz Oral QID  . sodium chloride flush  10-40 mL Intracatheter Q12H  . spironolactone  25 mg Oral Daily  . thiamine  100 mg Oral Daily   Or  . thiamine  100 mg Intravenous Daily   Dobutamine 4 mcg  OBJECTIVE  Vitals:   12/11/15 0000 12/11/15 0008 12/11/15 0500 12/11/15 0759  BP: 92/68 92/68 98/79  95/77  Pulse: 93 67 (!) 101 84  Resp: 17 (!) 22 19 19   Temp:  97.3 F (36.3 C) 97.4 F (36.3 C) 97.3 F (36.3 C)  TempSrc:  Oral Oral Oral  SpO2: 97% 100% 95% 100%  Weight:   212 lb 11.9 oz (96.5 kg)   Height:        Intake/Output Summary (Last 24 hours) at 12/11/15 0824 Last data filed at 12/11/15 0600  Gross per 24 hour  Intake            715.2 ml  Output              800 ml  Net            -84.8 ml   Filed Weights   12/09/15 0324 12/10/15 0411 12/11/15 0500  Weight: 205 lb 11 oz (93.3 kg) 209 lb 8 oz (95 kg) 212 lb 11.9 oz (96.5 kg)    PHYSICAL EXAM CVP 22 General: ill appearing male.   Neuro: Drowsy. Alert and oriented x3 . Moves all extremities spontaneously. Psych: Normal affect. HEENT:  scleral icterus.Temporal wasting  Neck: Supple without bruits. JVP to jaw  Lungs:  Clear dull at basese Heart: PMI laterally displaced irrg  +s3 3/6 systolic murmur radiating to the axilla and ULSB.  Abdomen: Soft, non-tender, +distended, BS + x 4.    Extremities: No clubbing, cyanosis. 3+ BLE  Edema  Ted hose in place.  DP/PT/Radials 2+ and equal bilaterally.  EKG: A fib 90s   Accessory Clinical Findings  CBC  Recent Labs  12/10/15 0355 12/11/15 0525  WBC 9.2 8.0  NEUTROABS 7.4  --   HGB 14.4 13.8  HCT 39.3 38.3*  MCV 108.0* 109.4*  PLT 57* 56*   Basic Metabolic Panel  Recent Labs  12/10/15 1200 12/11/15 0525  NA 120* 119*  K 4.1 3.5  CL 74* 71*  CO2 30 32  GLUCOSE 215* 208*  BUN 66* 64*  CREATININE 3.50* 3.32*  CALCIUM 8.5* 8.5*   Liver Function Tests  Recent Labs  12/10/15 1200 12/11/15 0525  AST 60* 55*  ALT 20 22  ALKPHOS 84 84  BILITOT 8.8* 7.9*  PROT 5.4* 5.3*  ALBUMIN 2.6* 2.5*   No results for input(s): LIPASE, AMYLASE in the last 72 hours. Cardiac Enzymes No results for input(s): CKTOTAL, CKMB, CKMBINDEX, TROPONINI in the last 72 hours. BNP Invalid input(s): POCBNP D-Dimer  No results for input(s): DDIMER in the last 72 hours. Hemoglobin A1C No results for input(s): HGBA1C in the last 72 hours. Fasting Lipid Panel No results for input(s): CHOL, HDL, LDLCALC, TRIG, CHOLHDL, LDLDIRECT in the last 72 hours. Thyroid Function Tests No results for input(s): TSH, T4TOTAL, T3FREE, THYROIDAB in the last 72 hours.  Invalid input(s): FREET3  TELE  afib at 90s   Radiology/Studies  Ct Abdomen Pelvis Wo Contrast  Result Date: 12/04/2015 CLINICAL DATA:  45 year old male with increased lower extremity swelling and abdominal pain. Jaundice. Initial encounter. EXAM: CT ABDOMEN AND PELVIS WITHOUT CONTRAST TECHNIQUE: Multidetector CT imaging of the abdomen and pelvis was performed following the standard protocol without IV contrast. COMPARISON:  CT chest abdomen and pelvis 05/05/2010. FINDINGS: New small to moderate bilateral layering pleural effusions with compressive lower lobe atelectasis. Cardiomegaly appears new since 2012. No pericardial effusion. No acute osseous abnormality identified. Anasarca,  with diffuse abdominal wall and dependent flank subcutaneous stranding. Moderate volume simple appearing free fluid in the pelvis. Small volume free fluid throughout the abdomen, including perihepatic and perisplenic fluid. Negative rectum and urinary bladder. Occasional sigmoid diverticula with no active inflammation. Decompressed left colon. Mild wall thickening of the transverse colon which might be reactive. More normal appearance of the right colon. Negative appendix. Decompressed terminal ileum. Oral contrast in the small bowel, but has not yet reached the TI. Small volume of oral contrast in the distal esophagus. Negative stomach. Decompressed duodenum. Mild hepatomegaly which is new since 2012. No discrete liver lesion identified in the absence of contrast. No intrahepatic or extrahepatic biliary ductal dilatation is evident on this noncontrast exam. Increased density within the gallbladder might be sludge. The main portal vein does not appear enlarged. Negative noncontrast spleen, no splenomegaly. Negative noncontrast pancreas and adrenal glands. Negative noncontrast kidneys. Aortoiliac calcified atherosclerosis noted. No lymphadenopathy identified. IMPRESSION: 1. Anasarca with abdominal and pelvic ascites and layering pleural effusions (small to moderate volume). 2. Cardiomegaly and hepatomegaly are new since 2012. No discrete liver lesion in the absence of contrast. No findings to suggest chronic portal hypertension. The top differential considerations in this clinical setting include heart failure with congestive hepatopathy versus acute or or chronic hepatitis. 3. Calcified aortic atherosclerosis. 4. Suggestion of gallbladder sludge. Electronically Signed   By: Odessa Fleming M.D.   On: 12/04/2015 20:49   Ct Head Wo Contrast  Result Date: 12/07/2015 CLINICAL DATA:  Fall.  History of atrial fibrillation. EXAM: CT HEAD WITHOUT CONTRAST TECHNIQUE: Contiguous axial images were obtained from the base of the  skull through the vertex without intravenous contrast. COMPARISON:  Head CT dated 05/05/2010. FINDINGS: Brain: There is generalized parenchymal atrophy, moderate in degree for age, with commensurate dilatation of the ventricles and sulci. There is no mass, hemorrhage, edema or other evidence of acute parenchymal abnormality. No extra-axial hemorrhage. Vascular: No hyperdense vessel or unexpected calcification. Skull: Negative for fracture or focal lesion. Sinuses/Orbits: No acute findings. Other: None. IMPRESSION: 1. No acute findings.  No intracranial mass, hemorrhage or edema. 2. Atrophy. Electronically Signed   By: Bary Richard M.D.   On: 12/07/2015 21:06   US Abdomen Complete  Result Date: 12/05/2015 CLINICAL DATA:  Patient with elevated LFTs. Anasarca. Alcohol abuse. EXAM: ABDOMEN ULTRASOUND COMPLETE COMPARISON:  CT abdomen pelvis 12/04/2015 FINDINGS: Gallbladder: Sludge within the gallbladder lumen. No gallbladder wall thickening or pericholecystic fluid. Negative sonographic Murphy's sign. Common bile duct: Diameter: 5 mm Liver: Diffusely increased in echogenicity. No focal lesion identified. IVC: No abnormality visualized.  Pancreas: Visualized portion unremarkable. Spleen: Size and appearance within normal limits. Right Kidney: Length: 12.0 cm. Echogenicity within normal limits. No mass or hydronephrosis visualized. Left Kidney: Length: 12.4 cm. Echogenicity within normal limits. No mass or hydronephrosis visualized. Abdominal aorta: No aneurysm visualized. Other findings: Diffuse ascites.  Bilateral pleural effusions. IMPRESSION: Gallbladder sludge. No secondary signs to suggest acute cholecystitis. Hepatic steatosis. Ascites. Bilateral pleural effusions. Electronically Signed   By: Annia Belt M.D.   On: 12/05/2015 17:16   Dg Chest Port 1 View  Addendum Date: 12/08/2015   ADDENDUM REPORT: 12/08/2015 20:03 ADDENDUM: The PICC line terminates in the central SVC. Electronically Signed   By: Gerome Sam III M.D   On: 12/08/2015 20:03   Result Date: 12/08/2015 CLINICAL DATA:  Ongoing alcohol abuse. History of renal and liver failure. EXAM: PORTABLE CHEST 1 VIEW COMPARISON:  December 05, 2015 FINDINGS: No pneumothorax. Diffuse bilateral interstitial opacities consistent with edema. More focal opacity in the right base. Mild cardiomegaly. No other acute abnormalities. IMPRESSION: 1. Pulmonary edema. 2. More focal opacity in the right base could represent superimposed developing infiltrate or asymmetric edema. Recommend clinical correlation and follow-up to resolution. Electronically Signed: By: Gerome Sam III M.D On: 12/08/2015 15:54   Dg Chest Port 1 View  Result Date: 12/05/2015 CLINICAL DATA:  45 year old male with cough and anasarca EXAM: PORTABLE CHEST 1 VIEW COMPARISON:  CT dated 05/05/2010 FINDINGS: There is mild cardiomegaly with increased vascular and interstitial prominence compatible with congestive changes and mild interstitial edema. Superimposed pneumonia is not excluded. No focal consolidation, pleural effusion, or pneumothorax. No acute osseous pathology. IMPRESSION: Mild cardiomegaly and congestive changes.  No focal consolidation. Electronically Signed   By: Elgie Collard M.D.   On: 12/05/2015 00:39    ASSESSMENT AND PLAN   1. Shock with multisystem organ failure 2. Paroxysmal Afib with RVR - Hx of PAF. Self discontinued warfarin about 2 years ago due to lack of insurance as he lost his job.  3. Acute on chronic systolic HF with biventricular failure - EF 35-40% 4. Acute on chronic renal failure, stage IV 5. Hepatic failure with anasarca 6. ETOH abuse 7. Hyponatremia  Persistent cardiogenic shock with multi-system organ failure. Todays CO-OX is 57% on dobutamine to 5 mcg. Volume status trending up. Continue lasix drip 15 mg per hour. Creatinine trending trending down. .   Continue IV amio 30 mg per hour. Not good candidate for long-term amio of AC due to ETOH  cirrhosis and liver failure. Platelets 56.    Palliative Care actively following. For now full code but needs to transition to Hospice.   Amy Clegg NP-C  8:24 AM  Patient seen and examined with Tonye Becket, NP. We discussed all aspects of the encounter. I agree with the assessment and plan as stated above.   Co-ox improved on dobutamine. Creatinine slightly improved. But essentially anuric. CVP very high. Would favor transition to complete comfort care as I do not think he can survive this. D/w Palliative Care team at bedside.   Sayer Masini,MD 9:05 AM

## 2015-12-11 NOTE — Progress Notes (Signed)
PROGRESS NOTE  Austin [name] ZOX:[id number] DOB: May 28, 1970 DOA: 12/04/2015 PCP: Aida Puffer, MD   Brief History:  45 year old male with a history of ongoing alcohol abuse, atrial fibrillation presented to the emergency department on 12/04/2015 with increasing lower extremity edema over several weeks and lower abdominal discomfort. The patient was found to have atrial fibrillation with RVR with initial hypotension with systolic blood pressure in the 80s. The patient was clinically fluid overloaded with anasarca and pulmonary edema. He was ultimately started on an amiodarone and lasix drip.  Critical care medicine was consulted. Cardiology was also consulted. Echocardiogram showed EF 35-40% with diffuse hypokinesis, moderate RV dilation and PAP 45. The patient was noted to have elevated procalcitonin and lactic acid and concerns for right lower lobe infiltrate. He was started on levofloxacin on 12/05/2015. Blood cultures and urine cultures have been negative. MRSA screen was negative. The patient remains critically ill with evidence of cardiac, hepatic, renal, and hematologic failure.  Unfortunately, Patient has very poor insight into his clinical situation.  Palliative care medicine was consulted, and the patient and family continue to desire the full scope of care.  Assessment/Plan: Acute respiratory failure with hypoxia -Secondary to pulmonary edema -Continue bronchodilators (xopenex) -Wean oxygen for saturation greater than 92% -stable on 3L [place]  Paroxysmal atrial fibrillation with RVR -Continue amiodarone drip -Not a candidate for anticoagulation secondary to ongoing alcohol use and liver cirrhosis -Self discontinued warfarin about 2 years ago due to lack of insurance as he lost his job.   Acute on chronic systolic CHF -Patient has evidence of right and left heart failure -12/05/2015 echo EF 35-40 percent, diffuse HK, moderate RV dilatation, PAP 45 -Continue furosemide drip  per cardiology--poor UO -NEG 2.3 L in past 72 hours, now non-oliguric -Amio and Dobutamine per cardiology--will need to tolerate a degree of hypotension -may need to consider consulting renal for UF if still pursing full scope of care  Portal hypertension with suspected underlying cirrhosis -Suspect underlying cirrhosis with initial presenting bilirubin 12.2 -Secondary to alcohol in setting of chronic congestion secondary to heart failure  -Patient has evidence of ascites and anasarca  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 -Previous baseline creatinine 1.0-1.1 -Secondary to hemodynamic changes   HYPOTENSION -multifactorial including third-spacing, amiodarone drip, dobutamine drip, diuresis, biventricular failure -WILL NEED TO TOLERATE SOME DEGREE OF HYPOTENSION IN HIS CLINICAL SITUATION  Coagulopathy -secondary to chronic liver disease -12/11/15 INR 2.02  Hyponatremia -Secondary to CHF and liver cirrhosis -Continue diuresis  Lactic acidosis -In part due to poor renal clearance and hepatic clearance -Monitor clinically -d/c levofloxacin after 12/10/15 dose  Goals of Care -Seen by palliative medicine -DNR--do not escalate care -poor insight into severity of his illness -overall poor prognosis   Disposition Plan:   Remain in stepdown  Family Communication:  No Family at bedside summarized  Consultants:  PCCM, cardiology, Palliative medicine  Code Status:  DNR  DVT Prophylaxis:  Auto-anticoagulated   Procedures: As Listed in Progress Note Above  Antibiotics: None   Subjective: Complains of some pain in his bilateral hips. Denies any fevers, chills, headache, chest pain, shortness breath, nausea, vomiting, diarrhea. No abdominal pain. No dysuria or hematuria.  Objective: Vitals:   12/11/15 0800 12/11/15 0900 12/11/15 1200 12/11/15 1303  BP:   96/70 96/70  Pulse: 95 93 96 (!) 53  Resp: 14 20 14  (!) 21  Temp:    97.4 F (36.3 C)  TempSrc:  Axillary  SpO2: (!) 48% 99% 100% 99%  Weight:      Height:        Intake/Output Summary (Last 24 hours) at 12/11/15 1447 Last data filed at 12/11/15 1400  Gross per 24 hour  Intake           1234.5 ml  Output              800 ml  Net            434.5 ml   Weight change: 1.471 kg (3 lb 3.9 oz) Exam:   General:  Pt is alert, follows commands appropriately, not in acute distress  HEENT: No icterus, No thrush, No neck mass, [place]/AT  Cardiovascular: RRR, S1/S2, no rubs, no gallops  Respiratory: bibasilar crackles, no wheeze  Abdomen: Soft/+BS, non tender, non distended, no guarding  Extremities: 2+ LE edema, No lymphangitis, No petechiae, No rashes, no synovitis   Data Reviewed: I have personally reviewed following labs and imaging studies Basic Metabolic Panel:  Recent Labs Lab 12/04/15 2337 12/05/15 0718  12/08/15 0758 12/09/15 0842 12/10/15 0355 12/10/15 1200 12/11/15 0525  NA  --  126*  125*  < > 125* 123* 121* 120* 119*  K  --  3.8  3.9  < > 3.6 3.1* 3.9 4.1 3.5  CL  --  78*  78*  < > 78* 77* 76* 74* 71*  CO2  --  33*  32  < > 27 33* 30 30 32  GLUCOSE  --  104*  104*  < > 104* 119* 129* 215* 208*  BUN  --  50*  51*  < > 67* 64* 65* 66* 64*  CREATININE  --  2.64*  2.58*  < > 3.14* 3.07* 3.39* 3.50* 3.32*  CALCIUM  --  8.7*  8.7*  < > 9.0 8.7* 8.7* 8.5* 8.5*  MG 1.7 1.7  --  1.9  --   --   --   --   PHOS  --  4.2  --  5.1*  --   --   --   --   < > = values in this interval not displayed. Liver Function Tests:  Recent Labs Lab 12/05/15 0718 12/06/15 1050 12/08/15 0758 12/10/15 1200 12/11/15 0525  AST 51* 48*  --  60* 55*  ALT 24 19  --  20 22  ALKPHOS 98 91  --  84 84  BILITOT 9.7* 8.6*  --  8.8* 7.9*  PROT 5.9* 5.6*  --  5.4* 5.3*  ALBUMIN 2.8* 2.7* 2.8* 2.6* 2.5*   No results for input(s): LIPASE, AMYLASE in the last 168 hours.  Recent Labs Lab 12/09/15 0428  AMMONIA 26   Coagulation Profile:  Recent Labs Lab 12/04/15 2137  12/08/15 0758 12/10/15 1200 12/11/15 0525  INR 1.99 2.16 2.08 2.02   CBC:  Recent Labs Lab 12/05/15 0718 12/06/15 1050 12/08/15 0758 12/10/15 0355 12/11/15 0525  WBC 9.0 8.4 8.9 9.2 8.0  NEUTROABS  --  6.4 6.5 7.4  --   HGB 14.5 14.6 15.7 14.4 13.8  HCT 40.5 40.7 43.3 39.3 38.3*  MCV 110.7* 112.7* 111.6* 108.0* 109.4*  PLT 121* 107* 86* 57* 56*   Cardiac Enzymes:  Recent Labs Lab 12/04/15 2337 12/05/15 0718 12/05/15 1026  TROPONINI 0.16* 0.14* 0.18*   BNP: Invalid input(s): POCBNP CBG: No results for input(s): GLUCAP in the last 168 hours. HbA1C: No results for input(s): HGBA1C in the last 72 hours. Urine analysis:  Component Value Date/Time   COLORURINE ORANGE (A) 12/04/2015 1357   APPEARANCEUR CLOUDY (A) 12/04/2015 1357   LABSPEC 1.022 12/04/2015 1357   PHURINE 5.0 12/04/2015 1357   GLUCOSEU NEGATIVE 12/04/2015 1357   HGBUR NEGATIVE 12/04/2015 1357   BILIRUBINUR LARGE (A) 12/04/2015 1357   KETONESUR 15 (A) 12/04/2015 1357   PROTEINUR 100 (A) 12/04/2015 1357   UROBILINOGEN 0.2 05/05/2010 0620   NITRITE POSITIVE (A) 12/04/2015 1357   LEUKOCYTESUR SMALL (A) 12/04/2015 1357   Sepsis Labs: @LABRCNTIP (procalcitonin:4,lacticidven:4) ) Recent Results (from the past 240 hour(s))  Culture, blood (Routine X 2) w Reflex to ID Panel     Status: None   Collection Time: 12/04/15  9:32 PM  Result Value Ref Range Status   Specimen Description BLOOD LEFT ANTECUBITAL  Final   Special Requests BOTTLES DRAWN AEROBIC AND ANAEROBIC 5CC  Final   Culture NO GROWTH 5 DAYS  Final   Report Status 12/09/2015 FINAL  Final  Culture, blood (Routine X 2) w Reflex to ID Panel     Status: None   Collection Time: 12/04/15  9:34 PM  Result Value Ref Range Status   Specimen Description BLOOD RIGHT HAND  Final   Special Requests IN PEDIATRIC BOTTLE 2CC  Final   Culture NO GROWTH 5 DAYS  Final   Report Status 12/09/2015 FINAL  Final  Culture, Urine     Status: None   Collection  Time: 12/05/15  6:58 AM  Result Value Ref Range Status   Specimen Description URINE, RANDOM  Final   Special Requests NONE  Final   Culture NO GROWTH  Final   Report Status 12/06/2015 FINAL  Final  MRSA PCR Screening     Status: None   Collection Time: 12/05/15 11:38 AM  Result Value Ref Range Status   MRSA by PCR NEGATIVE NEGATIVE Final    Comment:        The GeneXpert MRSA Assay (FDA approved for NASAL specimens only), is one component of a comprehensive MRSA colonization surveillance program. It is not intended to diagnose MRSA infection nor to guide or monitor treatment for MRSA infections.      Scheduled Meds: . calcium carbonate  1 tablet Oral BID  . pantoprazole  40 mg Oral Daily  . sodium chloride flush  10-40 mL Intracatheter Q12H  . spironolactone  25 mg Oral Daily   Continuous Infusions: . amiodarone 30 mg/hr (12/11/15 1017)  . DOBUTamine 5 mcg/kg/min (12/11/15 0700)  . furosemide (LASIX) infusion 15 mg/hr (12/11/15 0700)    Procedures/Studies: Ct Abdomen Pelvis Wo Contrast  Result Date: 12/04/2015 CLINICAL DATA:  45 year old male with increased lower extremity swelling and abdominal pain. Jaundice. Initial encounter. EXAM: CT ABDOMEN AND PELVIS WITHOUT CONTRAST TECHNIQUE: Multidetector CT imaging of the abdomen and pelvis was performed following the standard protocol without IV contrast. COMPARISON:  CT chest abdomen and pelvis 05/05/2010. FINDINGS: New small to moderate bilateral layering pleural effusions with compressive lower lobe atelectasis. Cardiomegaly appears new since 2012. No pericardial effusion. No acute osseous abnormality identified. Anasarca, with diffuse abdominal wall and dependent flank subcutaneous stranding. Moderate volume simple appearing free fluid in the pelvis. Small volume free fluid throughout the abdomen, including perihepatic and perisplenic fluid. Negative rectum and urinary bladder. Occasional sigmoid diverticula with no active  inflammation. Decompressed left colon. Mild wall thickening of the transverse colon which might be reactive. More normal appearance of the right colon. Negative appendix. Decompressed terminal ileum. Oral contrast in the small bowel, but has not yet  reached the TI. Small volume of oral contrast in the distal esophagus. Negative stomach. Decompressed duodenum. Mild hepatomegaly which is new since 2012. No discrete liver lesion identified in the absence of contrast. No intrahepatic or extrahepatic biliary ductal dilatation is evident on this noncontrast exam. Increased density within the gallbladder might be sludge. The main portal vein does not appear enlarged. Negative noncontrast spleen, no splenomegaly. Negative noncontrast pancreas and adrenal glands. Negative noncontrast kidneys. Aortoiliac calcified atherosclerosis noted. No lymphadenopathy identified. IMPRESSION: 1. Anasarca with abdominal and pelvic ascites and layering pleural effusions (small to moderate volume). 2. Cardiomegaly and hepatomegaly are new since 2012. No discrete liver lesion in the absence of contrast. No findings to suggest chronic portal hypertension. The top differential considerations in this clinical setting include heart failure with congestive hepatopathy versus acute or or chronic hepatitis. 3. Calcified aortic atherosclerosis. 4. Suggestion of gallbladder sludge. Electronically Signed   By: Odessa Fleming M.D.   On: 12/04/2015 20:49   Ct Head Wo Contrast  Result Date: 12/07/2015 CLINICAL DATA:  Fall.  History of atrial fibrillation. EXAM: CT HEAD WITHOUT CONTRAST TECHNIQUE: Contiguous axial images were obtained from the base of the skull through the vertex without intravenous contrast. COMPARISON:  Head CT dated 05/05/2010. FINDINGS: Brain: There is generalized parenchymal atrophy, moderate in degree for age, with commensurate dilatation of the ventricles and sulci. There is no mass, hemorrhage, edema or other evidence of acute  parenchymal abnormality. No extra-axial hemorrhage. Vascular: No hyperdense vessel or unexpected calcification. Skull: Negative for fracture or focal lesion. Sinuses/Orbits: No acute findings. Other: None. IMPRESSION: 1. No acute findings.  No intracranial mass, hemorrhage or edema. 2. Atrophy. Electronically Signed   By: Bary Richard M.D.   On: 12/07/2015 21:06   US Abdomen Complete  Result Date: 12/05/2015 CLINICAL DATA:  Patient with elevated LFTs. Anasarca. Alcohol abuse. EXAM: ABDOMEN ULTRASOUND COMPLETE COMPARISON:  CT abdomen pelvis 12/04/2015 FINDINGS: Gallbladder: Sludge within the gallbladder lumen. No gallbladder wall thickening or pericholecystic fluid. Negative sonographic Murphy's sign. Common bile duct: Diameter: 5 mm Liver: Diffusely increased in echogenicity. No focal lesion identified. IVC: No abnormality visualized. Pancreas: Visualized portion unremarkable. Spleen: Size and appearance within normal limits. Right Kidney: Length: 12.0 cm. Echogenicity within normal limits. No mass or hydronephrosis visualized. Left Kidney: Length: 12.4 cm. Echogenicity within normal limits. No mass or hydronephrosis visualized. Abdominal aorta: No aneurysm visualized. Other findings: Diffuse ascites.  Bilateral pleural effusions. IMPRESSION: Gallbladder sludge. No secondary signs to suggest acute cholecystitis. Hepatic steatosis. Ascites. Bilateral pleural effusions. Electronically Signed   By: Annia Belt M.D.   On: 12/05/2015 17:16   Dg Chest Port 1 View  Addendum Date: 12/08/2015   ADDENDUM REPORT: 12/08/2015 20:03 ADDENDUM: The PICC line terminates in the central SVC. Electronically Signed   By: Gerome Sam III M.D   On: 12/08/2015 20:03   Result Date: 12/08/2015 CLINICAL DATA:  Ongoing alcohol abuse. History of renal and liver failure. EXAM: PORTABLE CHEST 1 VIEW COMPARISON:  December 05, 2015 FINDINGS: No pneumothorax. Diffuse bilateral interstitial opacities consistent with edema. More focal opacity  in the right base. Mild cardiomegaly. No other acute abnormalities. IMPRESSION: 1. Pulmonary edema. 2. More focal opacity in the right base could represent superimposed developing infiltrate or asymmetric edema. Recommend clinical correlation and follow-up to resolution. Electronically Signed: By: Gerome Sam III M.D On: 12/08/2015 15:54   Dg Chest Port 1 View  Result Date: 12/05/2015 CLINICAL DATA:  45 year old male with cough and anasarca EXAM: PORTABLE CHEST  1 VIEW COMPARISON:  CT dated 05/05/2010 FINDINGS: There is mild cardiomegaly with increased vascular and interstitial prominence compatible with congestive changes and mild interstitial edema. Superimposed pneumonia is not excluded. No focal consolidation, pleural effusion, or pneumothorax. No acute osseous pathology. IMPRESSION: Mild cardiomegaly and congestive changes.  No focal consolidation. Electronically Signed   By: Elgie Collard M.D.   On: 12/05/2015 00:39    Lachell Rochette, DO  [facility] Pager [phone]  If 7PM-7AM, please contact night-coverage [email or web address] Password [email or web address] 12/11/2015, 2:47 PM   LOS: 7 days

## 2015-12-11 NOTE — Progress Notes (Signed)
Md made aware of pt's bp 75/58 map 66 hr 109-120.  Will continue to monitor pt.  If pt's map drops below 50 notify md.  Karena AddisonCoro, Alasha Mcguinness T

## 2015-12-11 NOTE — Progress Notes (Signed)
Daily Progress Note   Patient Name: Austin [name]       Date: 12/11/2015 DOB: 02/22/1971  Age: 45 y.o. MRN#: [id number] Attending Physician: Catarina Hartshorn, MD Primary Care Physician: Aida Puffer, MD Admit Date: 12/04/2015  Reason for Consultation/Follow-up: Establishing goals of care  Subjective: Patient complains of pain in his hips and legs.  He also complains of reflux.  States he is not feeling well after his bath this morning.  "I have not recovered from it".  I recommended that Mr. Hassing change his code status to DNR as a code would be very traumatic for him and he would be very unlikely to survive it.  He agreed.  I made Mr. Edgell repeat this back to me to ensure he understood.  He states that he is not ready to die yet, but he would like his pain and reflux treated.  I agreed to treat his symptoms.  I discussed the change in code status with his brother Gery Pray.  He accepted the change.  I recommended that we continue current care for another 24 - 48 hours and if there is no improvement we should strongly consider instituting strictly comfort measures.      Both Gery Pray and [place] asked that I speak with their father to update him.   Length of Stay: 7  Current Medications: Scheduled Meds:  . calcium carbonate  1 tablet Oral BID  . pantoprazole  40 mg Oral Daily  . potassium chloride  40 mEq Oral Once  . sodium chloride flush  10-40 mL Intracatheter Q12H  . spironolactone  25 mg Oral Daily    Continuous Infusions: . amiodarone 30 mg/hr (12/11/15 0701)  . DOBUTamine 5 mcg/kg/min (12/11/15 0700)  . furosemide (LASIX) infusion 15 mg/hr (12/11/15 0700)    PRN Meds: levalbuterol, LORazepam, morphine injection, ondansetron **OR** ondansetron (ZOFRAN) IV, oxyCODONE-acetaminophen, sodium  chloride flush  Physical Exam        Wd 45 yo male, pallor, appears older than stated age.  He is tremulous He drifts off to sleep easily. CV difficult to hear, rrr resp CTA no increased work of breathing. Abdomen + Hepatomegaly, NT, +BS Extremities +Ted hose, 3 +edema L>R   Vital Signs: BP 95/77 (BP Location: Left Arm)   Pulse 93   Temp 97.3 F (36.3 C) (Oral)  Resp 20   Ht 6\' 3"  (1.905 m)   Wt 96.5 kg (212 lb 11.9 oz)   SpO2 99%   BMI 26.59 kg/m  SpO2: SpO2: 99 % O2 Device: O2 Device: Nasal Cannula O2 Flow Rate: O2 Flow Rate (L/min): 2 L/min  Intake/output summary:   Intake/Output Summary (Last 24 hours) at 12/11/15 [id number] Last data filed at 12/11/15 0600  Gross per 24 hour  Intake            676.4 ml  Output              800 ml  Net           -123.6 ml   LBM: Last BM Date: 12/10/15 Baseline Weight: Weight: 92.1 kg (203 lb 0.7 oz) Most recent weight: Weight: 96.5 kg (212 lb 11.9 oz)       Palliative Assessment/Data:    Flowsheet Rows   Flowsheet Row Most Recent Value  Intake Tab  Referral Department  Hospitalist  Unit at Time of Referral  ICU  Palliative Care Primary Diagnosis  Cardiac  Date Notified  12/08/15  Palliative Care Type  New Palliative care  Reason for referral  Clarify Goals of Care, Psychosocial or Spiritual support  Date of Admission  12/04/15  Date first seen by Palliative Care  12/09/15  # of days Palliative referral response time  1 Day(s)  # of days IP prior to Palliative referral  4  Clinical Assessment  Palliative Performance Scale Score  30%  Pain Max last 24 hours  Not able to report  Pain Min Last 24 hours  Not able to report  Dyspnea Max Last 24 Hours  Not able to report  Dyspnea Min Last 24 hours  Not able to report  Nausea Max Last 24 Hours  Not able to report  Nausea Min Last 24 Hours  Not able to report  Anxiety Max Last 24 Hours  Not able to report  Anxiety Min Last 24 Hours  Not able to report  Other Max Last 24 Hours   Not able to report  Psychosocial & Spiritual Assessment  Palliative Care Outcomes  Patient/Family meeting held?  Yes  Who was at the meeting?  parents  Palliative Care follow-up planned  Yes, Facility      Patient Active Problem List   Diagnosis Date Noted  . Acute on chronic systolic CHF (congestive heart failure) (HCC) 12/10/2015  . Acute respiratory failure with hypoxia (HCC) 12/10/2015  . Acute renal failure superimposed on stage 4 chronic kidney disease (HCC) 12/10/2015  . Portal hypertension (HCC) 12/10/2015  . Coagulopathy (HCC) 12/10/2015  . Hyponatremia 12/10/2015  . Lactic acidosis 12/10/2015  . Edema   . Alcohol abuse, unspecified   . Alcoholic liver damage, unspecified   . Atrial fibrillation (HCC)   . Other abnormal blood chemistry   . Anxiety state   . Goals of care, counseling/discussion   . Palliative care encounter   . Cardiogenic shock (HCC)   . Jaundice   . Elevated LFTs   . Atrial fibrillation with RVR (HCC) 12/04/2015  . Anasarca 12/04/2015  . Alcoholic liver disease (HCC) 12/04/2015  . ARF (acute renal failure) (HCC) 12/04/2015  . Alcohol abuse 12/04/2015  . Acute kidney injury [facility](HCC)     Palliative Care Assessment & Plan   Patient Profile: 45 y.o. male  with past medical history of Atrial fibrillation, alcohol abuse, opioid use disorder, systolic heart failure admitted on 12/04/2015 with increasing lower extremity edema,  abdominal discomfort. While in the emergency room he was found to be in atrial fibrillation with RVR. He also was very hypotensive requiring a fluid bolus. Since admission he has become sicker and is now with multisystem failure: Worsening systolic heart function with an ejection fraction of 35%, kidney failure and now is on a dobutamine drip to maintain blood pressure. His last drink of alcohol was 2 days prior to admission  Assessment: Patient with a strong will to live.  However his liver, heart, and kidneys are failing him.  He does  not want prolonged life support.  He wants his brother, Gery Pray to be his HCPOA.  Code status has been changed to DNR.  Will not escalate care at this point.  Recommendations/Plan:  Continue current course of treatment.  Do not escalate care  Changed code status to DNR.  [name] = HCPOA  Low dose ativan PRN for anxiety.  Added Morphine PRN q2 for pain and dyspnea  Have requested RN to page me with father arrives.  Recommended to Gery Pray a trial of 24 - 48 hours before instituting full comfort care.   Code Status: DNR.   Prognosis:   Uncertain but likely days to weeks.  Most likely hospital death.    Discharge Planning:  Most likely hospital death.  Care plan was discussed with brother, Gery Pray.  Plan to meet with father later today.  Thank you for allowing the Palliative Medicine Team to assist in the care of this patient.   Time In: 9:00 Time Out: 9:35 Total Time 35 Prolonged Time Billed no      Greater than 50%  of this time was spent counseling and coordinating care related to the above assessment and plan.  Algis Downs, PA-C Palliative Medicine Pager: [phone]  Please contact Palliative Medicine Team phone at [phone] for questions and concerns.

## 2015-12-11 NOTE — Progress Notes (Signed)
   12/11/15 1300  Clinical Encounter Type  Visited With Patient  Visit Type Follow-up  Referral From Chaplain  Consult/Referral To Chaplain  Spiritual Encounters  Spiritual Needs Emotional  CHP followed up with briefly with patient. Family minister in room providing support and prayer. Rodney BoozeGail L Jahkai Yandell 12/11/15

## 2015-12-12 DIAGNOSIS — K7200 Acute and subacute hepatic failure without coma: Secondary | ICD-10-CM

## 2015-12-12 DIAGNOSIS — I480 Paroxysmal atrial fibrillation: Secondary | ICD-10-CM

## 2015-12-12 LAB — COMPREHENSIVE METABOLIC PANEL
ALBUMIN: 2.3 g/dL — AB (ref 3.5–5.0)
ALT: 22 U/L (ref 17–63)
AST: 53 U/L — AB (ref 15–41)
Alkaline Phosphatase: 77 U/L (ref 38–126)
Anion gap: 15 (ref 5–15)
BUN: 69 mg/dL — ABNORMAL HIGH (ref 6–20)
CHLORIDE: 72 mmol/L — AB (ref 101–111)
CO2: 32 mmol/L (ref 22–32)
CREATININE: 3.28 mg/dL — AB (ref 0.61–1.24)
Calcium: 8.3 mg/dL — ABNORMAL LOW (ref 8.9–10.3)
GFR calc Af Amer: 24 mL/min — ABNORMAL LOW (ref >60)
GFR, EST NON AFRICAN AMERICAN: 21 mL/min — AB (ref >60)
Glucose, Bld: 158 mg/dL — ABNORMAL HIGH (ref 65–99)
POTASSIUM: 3.5 mmol/L (ref 3.5–5.1)
SODIUM: 119 mmol/L — AB (ref 135–145)
Total Bilirubin: 7.3 mg/dL — ABNORMAL HIGH (ref 0.3–1.2)
Total Protein: 5.2 g/dL — ABNORMAL LOW (ref 6.5–8.1)

## 2015-12-12 LAB — CARBOXYHEMOGLOBIN
Carboxyhemoglobin: 1.3 % (ref 0.5–1.5)
Methemoglobin: 0.8 % (ref 0.0–1.5)
O2 Saturation: 60.3 %
TOTAL HEMOGLOBIN: 13.9 g/dL (ref 13.5–18.0)

## 2015-12-12 LAB — CBC WITH DIFFERENTIAL/PLATELET
Basophils Absolute: 0 10*3/uL (ref 0.0–0.1)
Basophils Relative: 0 %
Eosinophils Absolute: 0 10*3/uL (ref 0.0–0.7)
Eosinophils Relative: 0 %
HEMATOCRIT: 38.1 % — AB (ref 39.0–52.0)
HEMOGLOBIN: 13.7 g/dL (ref 13.0–17.0)
LYMPHS ABS: 0.3 10*3/uL — AB (ref 0.7–4.0)
LYMPHS PCT: 3 %
MCH: 39 pg — AB (ref 26.0–34.0)
MCHC: 36 g/dL (ref 30.0–36.0)
MCV: 108.5 fL — AB (ref 78.0–100.0)
MONOS PCT: 8 %
Monocytes Absolute: 0.7 10*3/uL (ref 0.1–1.0)
NEUTROS ABS: 7.9 10*3/uL — AB (ref 1.7–7.7)
NEUTROS PCT: 89 %
Platelets: 48 10*3/uL — ABNORMAL LOW (ref 150–400)
RBC: 3.51 MIL/uL — AB (ref 4.22–5.81)
RDW: 16.1 % — ABNORMAL HIGH (ref 11.5–15.5)
WBC: 8.8 10*3/uL (ref 4.0–10.5)

## 2015-12-12 LAB — GLUCOSE, CAPILLARY: GLUCOSE-CAPILLARY: 131 mg/dL — AB (ref 65–99)

## 2015-12-12 MED ORDER — CALCIUM CARBONATE ANTACID 500 MG PO CHEW
1.0000 | CHEWABLE_TABLET | Freq: Two times a day (BID) | ORAL | Status: DC | PRN
  Administered 2015-12-12 – 2015-12-13 (×2): 200 mg via ORAL
  Filled 2015-12-12 (×2): qty 1

## 2015-12-12 NOTE — Progress Notes (Signed)
Patient Name: Austin [name] Date of Encounter: 12/12/2015  45 yo man with ongoing alcohol abuse, systolic HF and history of AF treated admitted with HF, renal failure and liver failure.   SUBJECTIVE  Palliative Care consulted 12/10/15. Pt is now DNR/DNI with no escalation of care.  Plan is to continue current treatment for 24-48 hours with consideration of full comfort care.   On Dobutamine 5 mcg. Todays CO-OX is 60.3%.  CVP 16-17  Remains lethargic and weak. Flat affect.  Not in pain currently.  No distress.  He actually had good Urine output yesterday (2.4 L) and weight down 4 lbs. Likely this is not sustainable.  Echo shows moderate LV dysfunction - EF 35-40%. RV severely HK  Moderate PHTN Moderate - severe MR   CURRENT MEDS . pantoprazole  40 mg Oral Daily  . sodium chloride flush  10-40 mL Intracatheter Q12H  . spironolactone  25 mg Oral Daily   Dobutamine 5 mcg  OBJECTIVE  Vitals:   12/11/15 2300 12/12/15 0000 12/12/15 0325 12/12/15 0600  BP: (!) 76/57 (!) 74/49 (!) 73/59 (!) 89/63  Pulse: (!) 104 (!) 119 (!) 101 (!) 106  Resp: Temp:  97.3 F (36.3 C) 98 F (36.7 C)   TempSrc:  Oral Oral   SpO2: 96% 96% 97% 96%  Weight:   208 lb 15.9 oz (94.8 kg)   Height:        Intake/Output Summary (Last 24 hours) at 12/12/15 0752 Last data filed at 12/12/15 0600  Gross per 24 hour  Intake          1472.41 ml  Output             2625 ml  Net         -1152.59 ml   Filed Weights   12/10/15 0411 12/11/15 0500 12/12/15 0325  Weight: 209 lb 8 oz (95 kg) 212 lb 11.9 oz (96.5 kg) 208 lb 15.9 oz (94.8 kg)    PHYSICAL EXAM CVP 16-17 General: Chronically ill appearing male.   Neuro: Drowsy, but alert and oriented x3 . Moves all extremities spontaneously. Psych: Flat affect. HEENT:  scleral icterus.Temporal wasting  Neck: Supple without bruits. JVP up to jaw  Lungs:  Clear dull at basese Heart: PMI laterally displaced irrg  +s3 3/6 systolic murmur radiating  to the axilla and ULSB.  Abdomen: Soft, NT, mildly distended, no HSM. No bruits or masses. +BS  Extremities: No clubbing, cyanosis. 2-3+ BLE  Edema into thighs. Ted hose in place.  DP/PT/Radials 2+ and equal bilaterally.  EKG: Reviewed, Afib 100-110s  Accessory Clinical Findings  CBC  Recent Labs  12/10/15 0355 12/11/15 0525  WBC 9.2 8.0  NEUTROABS 7.4  --   HGB 14.4 13.8  HCT 39.3 38.3*  MCV 108.0* 109.4*  PLT 57* 56*   Basic Metabolic Panel  Recent Labs  12/10/15 1200 12/11/15 0525  NA 120* 119*  K 4.1 3.5  CL 74* 71*  CO2 30 32  GLUCOSE 215* 208*  BUN 66* 64*  CREATININE 3.50* 3.32*  CALCIUM 8.5* 8.5*   Liver Function Tests  Recent Labs  12/10/15 1200 12/11/15 0525  AST 60* 55*  ALT 20 22  ALKPHOS 84 84  BILITOT 8.8* 7.9*  PROT 5.4* 5.3*  ALBUMIN 2.6* 2.5*   No results for input(s): LIPASE, AMYLASE in the last 72 hours. Cardiac Enzymes No results for input(s): CKTOTAL, CKMB, CKMBINDEX, TROPONINI in the last 72 hours. BNP  Invalid input(s): POCBNP D-Dimer No results for input(s): DDIMER in the last 72 hours. Hemoglobin A1C No results for input(s): HGBA1C in the last 72 hours. Fasting Lipid Panel No results for input(s): CHOL, HDL, LDLCALC, TRIG, CHOLHDL, LDLDIRECT in the last 72 hours. Thyroid Function Tests No results for input(s): TSH, T4TOTAL, T3FREE, THYROIDAB in the last 72 hours.  Invalid input(s): FREET3  TELE  afib at 90s   Radiology/Studies  Ct Abdomen Pelvis Wo Contrast  Result Date: 12/04/2015 CLINICAL DATA:  45 year old male with increased lower extremity swelling and abdominal pain. Jaundice. Initial encounter. EXAM: CT ABDOMEN AND PELVIS WITHOUT CONTRAST TECHNIQUE: Multidetector CT imaging of the abdomen and pelvis was performed following the standard protocol without IV contrast. COMPARISON:  CT chest abdomen and pelvis 05/05/2010. FINDINGS: New small to moderate bilateral layering pleural effusions with compressive lower lobe  atelectasis. Cardiomegaly appears new since 2012. No pericardial effusion. No acute osseous abnormality identified. Anasarca, with diffuse abdominal wall and dependent flank subcutaneous stranding. Moderate volume simple appearing free fluid in the pelvis. Small volume free fluid throughout the abdomen, including perihepatic and perisplenic fluid. Negative rectum and urinary bladder. Occasional sigmoid diverticula with no active inflammation. Decompressed left colon. Mild wall thickening of the transverse colon which might be reactive. More normal appearance of the right colon. Negative appendix. Decompressed terminal ileum. Oral contrast in the small bowel, but has not yet reached the TI. Small volume of oral contrast in the distal esophagus. Negative stomach. Decompressed duodenum. Mild hepatomegaly which is new since 2012. No discrete liver lesion identified in the absence of contrast. No intrahepatic or extrahepatic biliary ductal dilatation is evident on this noncontrast exam. Increased density within the gallbladder might be sludge. The main portal vein does not appear enlarged. Negative noncontrast spleen, no splenomegaly. Negative noncontrast pancreas and adrenal glands. Negative noncontrast kidneys. Aortoiliac calcified atherosclerosis noted. No lymphadenopathy identified. IMPRESSION: 1. Anasarca with abdominal and pelvic ascites and layering pleural effusions (small to moderate volume). 2. Cardiomegaly and hepatomegaly are new since 2012. No discrete liver lesion in the absence of contrast. No findings to suggest chronic portal hypertension. The top differential considerations in this clinical setting include heart failure with congestive hepatopathy versus acute or or chronic hepatitis. 3. Calcified aortic atherosclerosis. 4. Suggestion of gallbladder sludge. Electronically Signed   By: Odessa FlemingH  Hall M.D.   On: 12/04/2015 20:49   Ct Head Wo Contrast  Result Date: 12/07/2015 CLINICAL DATA:  Fall.  History of  atrial fibrillation. EXAM: CT HEAD WITHOUT CONTRAST TECHNIQUE: Contiguous axial images were obtained from the base of the skull through the vertex without intravenous contrast. COMPARISON:  Head CT dated 05/05/2010. FINDINGS: Brain: There is generalized parenchymal atrophy, moderate in degree for age, with commensurate dilatation of the ventricles and sulci. There is no mass, hemorrhage, edema or other evidence of acute parenchymal abnormality. No extra-axial hemorrhage. Vascular: No hyperdense vessel or unexpected calcification. Skull: Negative for fracture or focal lesion. Sinuses/Orbits: No acute findings. Other: None. IMPRESSION: 1. No acute findings.  No intracranial mass, hemorrhage or edema. 2. Atrophy. Electronically Signed   By: Bary RichardStan  Maynard M.D.   On: 12/07/2015 21:06   [place] Abdomen Complete  Result Date: 12/05/2015 CLINICAL DATA:  Patient with elevated LFTs. Anasarca. Alcohol abuse. EXAM: ABDOMEN ULTRASOUND COMPLETE COMPARISON:  CT abdomen pelvis 12/04/2015 FINDINGS: Gallbladder: Sludge within the gallbladder lumen. No gallbladder wall thickening or pericholecystic fluid. Negative sonographic Murphy's sign. Common bile duct: Diameter: 5 mm Liver: Diffusely increased in echogenicity. No focal lesion identified.  IVC: No abnormality visualized. Pancreas: Visualized portion unremarkable. Spleen: Size and appearance within normal limits. Right Kidney: Length: 12.0 cm. Echogenicity within normal limits. No mass or hydronephrosis visualized. Left Kidney: Length: 12.4 cm. Echogenicity within normal limits. No mass or hydronephrosis visualized. Abdominal aorta: No aneurysm visualized. Other findings: Diffuse ascites.  Bilateral pleural effusions. IMPRESSION: Gallbladder sludge. No secondary signs to suggest acute cholecystitis. Hepatic steatosis. Ascites. Bilateral pleural effusions. Electronically Signed   By: Annia Belt M.D.   On: 12/05/2015 17:16   Dg Chest Port 1 View  Addendum Date: 12/08/2015     ADDENDUM REPORT: 12/08/2015 20:03 ADDENDUM: The PICC line terminates in the central SVC. Electronically Signed   By: Gerome Sam III M.D   On: 12/08/2015 20:03   Result Date: 12/08/2015 CLINICAL DATA:  Ongoing alcohol abuse. History of renal and liver failure. EXAM: PORTABLE CHEST 1 VIEW COMPARISON:  December 05, 2015 FINDINGS: No pneumothorax. Diffuse bilateral interstitial opacities consistent with edema. More focal opacity in the right base. Mild cardiomegaly. No other acute abnormalities. IMPRESSION: 1. Pulmonary edema. 2. More focal opacity in the right base could represent superimposed developing infiltrate or asymmetric edema. Recommend clinical correlation and follow-up to resolution. Electronically Signed: By: Gerome Sam III M.D On: 12/08/2015 15:54   Dg Chest Port 1 View  Result Date: 12/05/2015 CLINICAL DATA:  45 year old male with cough and anasarca EXAM: PORTABLE CHEST 1 VIEW COMPARISON:  CT dated 05/05/2010 FINDINGS: There is mild cardiomegaly with increased vascular and interstitial prominence compatible with congestive changes and mild interstitial edema. Superimposed pneumonia is not excluded. No focal consolidation, pleural effusion, or pneumothorax. No acute osseous pathology. IMPRESSION: Mild cardiomegaly and congestive changes.  No focal consolidation. Electronically Signed   By: Elgie Collard M.D.   On: 12/05/2015 00:39    ASSESSMENT AND PLAN   1. Shock with multisystem organ failure 2. Paroxysmal Afib with RVR - Hx of PAF. Self discontinued warfarin about 2 years ago due to lack of insurance as he lost his job.  3. Acute on chronic systolic HF with biventricular failure - EF 35-40% 4. Acute on chronic renal failure, stage IV 5. Hepatic failure with anasarca 6. ETOH abuse 7. Hyponatremia  Persistent cardiogenic shock with multi-system organ failure. Todays CO-OX is 60.3% on dobutamine to 5 mcg.   Volume status slightly improved overnight. Will continue lasix drip  15 mg per hour. Any further decompensation will likely mean transition to comfort care. BMET/CBC pending for today.   Continue IV amio 30 mg per hour. Not good candidate for long-term amio of AC due to ETOH cirrhosis and liver failure.   Although he is peeing more, he is more tachycardic and pressures extremely soft this am.  Overall very poor prognosis.   Palliative Care discussions ongoing. Now DNR/DNI with consideration for full comfort care.  Continue current treatment for at least 24 more hours.   Graciella Freer PA-C  7:52 AM   Patient seen and examined with Otilio Saber, PA-C. We discussed all aspects of the encounter. I agree with the assessment and plan as stated above.   He is actually now starting to respond to dobutamine and IV lasix. Cardiac output much improved and volume status getting better. However BP remains extremely low and he is extremely lethargic. Have reviewed Palliative Care notes and agree that we should not escalate care. Will continue dobutamine and lasix for now and see what happens over next 24 hours. He is not a candidate for home inotropes so  once diureses we will wean dobutamine. Await BMET for this am.   Nikai Quest,MD 8:23 AM

## 2015-12-12 NOTE — Progress Notes (Addendum)
Daily Progress Note   Patient Name: Austin [name]       Date: 12/12/2015 DOB: 07-30-1970  Age: 45 y.o. MRN#: [id number] Attending Physician: Austin Hillock, MD Primary Care Physician: Austin Roers, MD Admit Date: 12/04/2015  Reason for Consultation/Follow-up: Establishing goals of care  Subjective: Patient sitting up.   More alert today.  Called his parents at 7:30 this morning.  Discussed football and Austin [name] with me.  (He's a Panthers fan and is looking forward to the game tonight).    Still complaining of pain in hips and legs.  Asking for a [name] biscuit.  I met with father/mother/2 sisters/daughter and HCPOA brother yesterday.  They understand he is dying.  They want to support him to live as long as he can as well as he can.  Met with Mother/Father/Brother and patient again today.  Family aware that even though he appears to be fighting harder today, his prognosis is very poor and he is not expected to survive.  If his organs allow for treatment to be effective & his will is strong enough and he improves - his goal is to go home for a couple of days.  Ideally he would go home to his parents house with full hospice services.   Length of Stay: 8  Current Medications: Scheduled Meds:  . pantoprazole  40 mg Oral Daily  . sodium chloride flush  10-40 mL Intracatheter Q12H  . spironolactone  25 mg Oral Daily    Continuous Infusions: . amiodarone 30 mg/hr (12/12/15 0551)  . DOBUTamine 5 mcg/kg/min (12/12/15 0200)  . furosemide (LASIX) infusion 15 mg/hr (12/11/15 1901)    PRN Meds: calcium carbonate, levalbuterol, LORazepam, morphine injection, ondansetron **OR** ondansetron (ZOFRAN) IV, oxyCODONE-acetaminophen, sodium chloride flush  Physical Exam        Wd 45 yo male,  pallor, appears older than stated age.  He is alert, but still drifts off to sleep quickly. ++ Jaundice CV difficult to hear, rrr resp CTA no increased work of breathing. Abdomen + Hepatomegaly, NT, +BS Extremities +Ted hose, 3 +edema L>R   Vital Signs: BP (!) 81/63   Pulse (!) 102   Temp 97.5 F (36.4 C) (Oral)   Resp 11   Ht 6' 3"  (1.905 m)   Wt 94.8 kg (208 lb 15.9 oz)   SpO2  96%   BMI 26.12 kg/m  SpO2: SpO2: 96 % O2 Device: O2 Device: Not Delivered O2 Flow Rate: O2 Flow Rate (L/min): 2 L/min  Intake/output summary:   Intake/Output Summary (Last 24 hours) at 12/12/15 0906 Last data filed at 12/12/15 0600  Gross per 24 hour  Intake          1274.81 ml  Output             2625 ml  Net         -1350.19 ml   LBM: Last BM Date: 12/10/15 Baseline Weight: Weight: 92.1 kg (203 lb 0.7 oz) Most recent weight: Weight: 94.8 kg (208 lb 15.9 oz)       Palliative Assessment/Data:    Flowsheet Rows   Flowsheet Row Most Recent Value  Intake Tab  Referral Department  Hospitalist  Unit at Time of Referral  ICU  Palliative Care Primary Diagnosis  Cardiac  Date Notified  12/08/15  Palliative Care Type  New Palliative care  Reason for referral  Clarify Goals of Care, Psychosocial or Spiritual support  Date of Admission  12/04/15  Date first seen by Palliative Care  12/09/15  # of days Palliative referral response time  1 Day(s)  # of days IP prior to Palliative referral  4  Clinical Assessment  Palliative Performance Scale Score  30%  Pain Max last 24 hours  Not able to report  Pain Min Last 24 hours  Not able to report  Dyspnea Max Last 24 Hours  Not able to report  Dyspnea Min Last 24 hours  Not able to report  Nausea Max Last 24 Hours  Not able to report  Nausea Min Last 24 Hours  Not able to report  Anxiety Max Last 24 Hours  Not able to report  Anxiety Min Last 24 Hours  Not able to report  Other Max Last 24 Hours  Not able to report  Psychosocial & Spiritual  Assessment  Palliative Care Outcomes  Patient/Family meeting held?  Yes  Who was at the meeting?  parents  Palliative Care follow-up planned  Yes, Facility      Patient Active Problem List   Diagnosis Date Noted  . DNR (do not resuscitate)   . Arthralgia of both lower legs   . Acute on chronic systolic CHF (congestive heart failure) ([place]) 12/10/2015  . Acute respiratory failure with hypoxia ([place]) 12/10/2015  . Acute renal failure superimposed on stage 4 chronic kidney disease ([place]) 12/10/2015  . Portal hypertension ([place]) 12/10/2015  . Coagulopathy ([place]) 12/10/2015  . Hyponatremia 12/10/2015  . Lactic acidosis 12/10/2015  . Edema   . Alcohol abuse, unspecified   . Alcoholic liver damage, unspecified   . Atrial fibrillation ([place])   . Other abnormal blood chemistry   . Anxiety state   . Goals of care, counseling/discussion   . Palliative care encounter   . Cardiogenic shock ([place])   . Jaundice   . Elevated LFTs   . Atrial fibrillation with RVR ([place]) 12/04/2015  . Anasarca 12/04/2015  . Alcoholic liver disease ([place]) 12/04/2015  . ARF (acute renal failure) ([place]) 12/04/2015  . Alcohol abuse 12/04/2015  . Acute kidney injury [facility])     Palliative Care Assessment & Plan   Patient Profile: 45 y.o. male  with past medical history of Atrial fibrillation, alcohol abuse, opioid use disorder, systolic heart failure admitted on 12/04/2015 with increasing lower extremity edema, abdominal discomfort. While in the emergency room he was found to  be in atrial fibrillation with RVR. He also was very hypotensive requiring a fluid bolus. Since admission he has become sicker and is now with multisystem failure: Worsening systolic heart function with an ejection fraction of 35%, kidney failure and now is on a dobutamine drip to maintain blood pressure. His last drink of alcohol was 2 days prior to admission  Assessment: Patient with a strong will to live.  However his liver, heart, and kidneys are  failing him.  He does not want prolonged life support.  He wants his brother, Austin [name] to be his HCPOA.  Code status has been changed to DNR.  Will not escalate care at this point. Patient's will to live is strong.  Family wants to support his wishes.  Recommendations/Plan:  Continue current course of treatment.  Do not escalate care  Changed code status to DNR.  Austin [name] = HCPOA  Low dose ativan PRN for anxiety.  Added Morphine PRN q2 for pain and dyspnea  If by some miracle he improves it would be great to see him be able to leave the hospital with hospice services.   Code Status: DNR.   Prognosis:   Uncertain but likely days to weeks.  Most likely hospital death.    Discharge Planning:  Most likely hospital death.  Care plan was discussed with brother, Austin [name].  Plan to meet with father later today.  Thank you for allowing the Palliative Medicine Team to assist in the care of this patient.   Time In: 8:45 Time Out: 9:15 Total Time 25 Prolonged Time Billed no      Greater than 50%  of this time was spent counseling and coordinating care related to the above assessment and plan.  Imogene Burn, PA-C Palliative Medicine Pager: [phone]  Please contact Palliative Medicine Team phone at [phone] for questions and concerns.                                                                                                                                                                                                               Daily Progress Note   Patient Name: Austin [name]       Date: 12/12/2015 DOB: 1971/04/23  Age: 45 y.o. MRN#: [id number] Attending Physician: Austin Hillock, MD Primary Care Physician: Austin Roers, MD Admit Date: 12/04/2015  Reason for Consultation/Follow-up: Establishing goals of care  Subjective: Patient complains of pain in his hips and legs.  He also complains of reflux.  States he is not feeling well after his bath this morning.  "I  have not  recovered from it".  I recommended that Mr. Alwin change his code status to DNR as a code would be very traumatic for him and he would be very unlikely to survive it.  He agreed.  I made Mr. Volden repeat this back to me to ensure he understood.  He states that he is not ready to die yet, but he would like his pain and reflux treated.  I agreed to treat his symptoms.  I discussed the change in code status with his brother Austin [name].  He accepted the change.  I recommended that we continue current care for another 24 - 48 hours and if there is no improvement we should strongly consider instituting strictly comfort measures.      Both Austin [name] and [place] asked that I speak with their father to update him.   Length of Stay: 8  Current Medications: Scheduled Meds:  . pantoprazole  40 mg Oral Daily  . sodium chloride flush  10-40 mL Intracatheter Q12H  . spironolactone  25 mg Oral Daily    Continuous Infusions: . amiodarone 30 mg/hr (12/12/15 0551)  . DOBUTamine 5 mcg/kg/min (12/12/15 0200)  . furosemide (LASIX) infusion 15 mg/hr (12/11/15 1901)    PRN Meds: calcium carbonate, levalbuterol, LORazepam, morphine injection, ondansetron **OR** ondansetron (ZOFRAN) IV, oxyCODONE-acetaminophen, sodium chloride flush  Physical Exam        Wd 45 yo male, pallor, appears older than stated age.  He is tremulous He drifts off to sleep easily. CV difficult to hear, rrr resp CTA no increased work of breathing. Abdomen + Hepatomegaly, NT, +BS Extremities +Ted hose, 3 +edema L>R   Vital Signs: BP (!) 81/63   Pulse (!) 102   Temp 97.5 F (36.4 C) (Oral)   Resp 11   Ht 6' 3"  (1.905 m)   Wt 94.8 kg (208 lb 15.9 oz)   SpO2 96%   BMI 26.12 kg/m  SpO2: SpO2: 96 % O2 Device: O2 Device: Not Delivered O2 Flow Rate: O2 Flow Rate (L/min): 2 L/min  Intake/output summary:   Intake/Output Summary (Last 24 hours) at 12/12/15 0908 Last data filed at 12/12/15 0600  Gross per 24 hour  Intake          1274.81  ml  Output             2625 ml  Net         -1350.19 ml   LBM: Last BM Date: 12/10/15 Baseline Weight: Weight: 92.1 kg (203 lb 0.7 oz) Most recent weight: Weight: 94.8 kg (208 lb 15.9 oz)       Palliative Assessment/Data:    Flowsheet Rows   Flowsheet Row Most Recent Value  Intake Tab  Referral Department  Hospitalist  Unit at Time of Referral  ICU  Palliative Care Primary Diagnosis  Cardiac  Date Notified  12/08/15  Palliative Care Type  New Palliative care  Reason for referral  Clarify Goals of Care, Psychosocial or Spiritual support  Date of Admission  12/04/15  Date first seen by Palliative Care  12/09/15  # of days Palliative referral response time  1 Day(s)  # of days IP prior to Palliative referral  4  Clinical Assessment  Palliative Performance Scale Score  30%  Pain Max last 24 hours  Not able to report  Pain Min Last 24 hours  Not able to report  Dyspnea Max Last 24 Hours  Not able to report  Dyspnea Min Last 24 hours  Not able  to report  Nausea Max Last 24 Hours  Not able to report  Nausea Min Last 24 Hours  Not able to report  Anxiety Max Last 24 Hours  Not able to report  Anxiety Min Last 24 Hours  Not able to report  Other Max Last 24 Hours  Not able to report  Psychosocial & Spiritual Assessment  Palliative Care Outcomes  Patient/Family meeting held?  Yes  Who was at the meeting?  parents  Palliative Care follow-up planned  Yes, Facility      Patient Active Problem List   Diagnosis Date Noted  . DNR (do not resuscitate)   . Arthralgia of both lower legs   . Acute on chronic systolic CHF (congestive heart failure) ([place]) 12/10/2015  . Acute respiratory failure with hypoxia ([place]) 12/10/2015  . Acute renal failure superimposed on stage 4 chronic kidney disease ([place]) 12/10/2015  . Portal hypertension ([place]) 12/10/2015  . Coagulopathy ([place]) 12/10/2015  . Hyponatremia 12/10/2015  . Lactic acidosis 12/10/2015  . Edema   . Alcohol abuse, unspecified   .  Alcoholic liver damage, unspecified   . Atrial fibrillation ([place])   . Other abnormal blood chemistry   . Anxiety state   . Goals of care, counseling/discussion   . Palliative care encounter   . Cardiogenic shock ([place])   . Jaundice   . Elevated LFTs   . Atrial fibrillation with RVR ([place]) 12/04/2015  . Anasarca 12/04/2015  . Alcoholic liver disease ([place]) 12/04/2015  . ARF (acute renal failure) ([place]) 12/04/2015  . Alcohol abuse 12/04/2015  . Acute kidney injury [facility])     Palliative Care Assessment & Plan   Patient Profile: 45 y.o. male  with past medical history of Atrial fibrillation, alcohol abuse, opioid use disorder, systolic heart failure admitted on 12/04/2015 with increasing lower extremity edema, abdominal discomfort. While in the emergency room he was found to be in atrial fibrillation with RVR. He also was very hypotensive requiring a fluid bolus. Since admission he has become sicker and is now with multisystem failure: Worsening systolic heart function with an ejection fraction of 35%, kidney failure and now is on a dobutamine drip to maintain blood pressure. His last drink of alcohol was 2 days prior to admission  Assessment: Patient with a strong will to live.  However his liver, heart, and kidneys are failing him.  He does not want prolonged life support.  He wants his brother, Austin [name] to be his HCPOA.  Code status has been changed to DNR.  Will not escalate care at this point.  Recommendations/Plan:  Continue current course of treatment.  Do not escalate care  Changed code status to DNR.  Austin [name] = HCPOA  Low dose ativan PRN for anxiety.  Added Morphine PRN q2 for pain and dyspnea  Liberalized diet to regular (family bringing in [employer] biscuits)  Heating pad for hip / leg pain.  Will continue to monitor progress and refine goals according to his progress.   Code Status: DNR.   Prognosis:   Uncertain but likely days to weeks.  Most likely hospital death.     Discharge Planning:  Most likely hospital death.  Care plan was discussed with brother, Austin [name].  Plan to meet with father later today.  Thank you for allowing the Palliative Medicine Team to assist in the care of this patient.   Time In: 8:45 Time Out: 9:15 Total Time 30 Prolonged Time Billed no      Greater than 50%  of this  time was spent counseling and coordinating care related to the above assessment and plan.  Imogene Burn, PA-C Palliative Medicine Pager: [phone]  Please contact Palliative Medicine Team phone at [phone] for questions and concerns.

## 2015-12-12 NOTE — Progress Notes (Signed)
[facility]  PROGRESS NOTE  Austin [name] ZOX:[id number] DOB: [birth year] DOA: 12/04/2015 PCP: Aida Puffer, MD    Brief HPI:  45 year old male with a history of ongoing alcohol abuse, atrial fibrillation presented to the emergency department on 12/04/2015 with increasing lower extremity edema over several weeks and lower abdominal discomfort. The patient was found to have atrial fibrillation with RVR with initial hypotension with systolic blood pressure in the 80s. The patient was clinically fluid overloaded with anasarca and pulmonary edema. He was ultimately started on an amiodarone and lasix drip. Critical caremedicine was consulted.Cardiology was also consulted.Echocardiogram showed EF 35-40% with diffuse hypokinesis, moderate RV dilation and PAP 45. The patient was noted to have elevated procalcitonin and lactic acid and concerns for right lower lobe infiltrate. He was started on levofloxacin on 12/05/2015. Blood cultures and urine cultures have been negative. MRSA screen was negative. The patient remains critically ill with evidence of cardiac, hepatic, renal, and hematologic failure. Unfortunately, Patient has very poor insight into his clinical situation. Palliative care medicine was consulted, andthe patient and family continue to desirethe full scope of care.     Assessment/Plan:    1. Acute on chronic systolic CHF- patient has evidence of right and left heart failure, as per echo EF 35-40%. Diffuse hypokinesis, moderate right ventricular dilation, pulmonary artery pressure 45. Patient currently on furosemide drip per cardiology. Also on dobutamine as well as a more developed by cardiology. Good urine output and weight down 4 pounds. Patient to continue with current care, and if no improvement plan for full comfort care. 2. Paroxysmal atrial fibrillation with RVR- continue amiodarone drip, he is not a candidate for anticoagulation secondary to ongoing alcohol use and liver  cirrhosis. Patient self discontinued warfarin 2 years ago due to lack of insurance as he lost his job. 3. Portal hypertension with suspected underlying liver cirrhosis- suspect liver cirrhosis  as initial bilirubin was 12.2, now slowly improved to 7.3. Secondary to alcohol abuse and right heart failure. INR 2.2 4. 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 Will follow BMP in a.m. 5. Hyponatremia- secondary to CHF and liver cirrhosis. Check BMP in a.m. 6. Goals of care- patient seen by palliative care, now DO NOT RESUSCITATE, no escalation of care.    DVT prophylaxis: INR > 2.0 Code Status: DNR Family Communication: No family at bedside Disposition Plan: To be decided   Consultants:  Cardiology  Palliative care  PCCM  Procedures:  Echocardiogram  Antibiotics:  None  Subjective    Patient seen and examined, complains of low back pain due to bed position. He denies chest pain, no shortness of breath.  Objective    Objective: Vitals:   12/12/15 1000 12/12/15 1100 12/12/15 1200 12/12/15 1225  BP: (!) 75/55  (!) 76/62   Pulse: 100 (!) 57 99   Resp: (!) 8 10 (!) 8   Temp:    97.2 F (36.2 C)  TempSrc:    Oral  SpO2: 97% 97% 97%   Weight:      Height:        Intake/Output Summary (Last 24 hours) at 12/12/15 1437 Last data filed at 12/12/15 0600  Gross per 24 hour  Intake           980.81 ml  Output             2625 ml  Net         -1644.19 ml   [employer]  12/10/15 0411 12/11/15 0500 12/12/15 0325  Weight: 95 kg (209 lb 8 oz) 96.5 kg (212 lb 11.9 oz) 94.8 kg (208 lb 15.9 oz)    Examination:  General exam: Appears calm and comfortable  Respiratory system: Clear to auscultation. Respiratory effort normal. Cardiovascular system: S1 & S2 heard, RRR. No JVD, murmurs, rubs, gallops or clicks.b/l  2 + pitting edema Gastrointestinal system: Abdomen is nondistended, soft and nontender. No  organomegaly or masses felt. Normal bowel sounds heard. Central nervous system: Alert and oriented. No focal neurological deficits. Extremities: Symmetric 5 x 5 power.    Data Reviewed: I have personally reviewed following labs and imaging studies Basic Metabolic Panel:  Recent Labs Lab 12/08/15 0758 12/09/15 0842 12/10/15 0355 12/10/15 1200 12/11/15 0525 12/12/15 0800  NA 125* 123* 121* 120* 119* 119*  K 3.6 3.1* 3.9 4.1 3.5 3.5  CL 78* 77* 76* 74* 71* 72*  CO2 27 33* 30 30 32 32  GLUCOSE 104* 119* 129* 215* 208* 158*  BUN 67* 64* 65* 66* 64* 69*  CREATININE 3.14* 3.07* 3.39* 3.50* 3.32* 3.28*  CALCIUM 9.0 8.7* 8.7* 8.5* 8.5* 8.3*  MG 1.9  --   --   --   --   --   PHOS 5.1*  --   --   --   --   --    Liver Function Tests:  Recent Labs Lab 12/06/15 1050 12/08/15 0758 12/10/15 1200 12/11/15 0525 12/12/15 0800  AST 48*  --  60* 55* 53*  ALT 19  --  20 22 22   ALKPHOS 91  --  84 84 77  BILITOT 8.6*  --  8.8* 7.9* 7.3*  PROT 5.6*  --  5.4* 5.3* 5.2*  ALBUMIN 2.7* 2.8* 2.6* 2.5* 2.3*   No results for input(s): LIPASE, AMYLASE in the last 168 hours.  Recent Labs Lab 12/09/15 0428  AMMONIA 26   CBC:  Recent Labs Lab 12/06/15 1050 12/08/15 0758 12/10/15 0355 12/11/15 0525 12/12/15 0819  WBC 8.4 8.9 9.2 8.0 8.8  NEUTROABS 6.4 6.5 7.4  --  7.9*  HGB 14.6 15.7 14.4 13.8 13.7  HCT 40.7 43.3 39.3 38.3* 38.1*  MCV 112.7* 111.6* 108.0* 109.4* 108.5*  PLT 107* 86* 57* 56* 48*     Recent Results (from the past 240 hour(s))  Culture, blood (Routine X 2) w Reflex to ID Panel     Status: None   Collection Time: 12/04/15  9:32 PM  Result Value Ref Range Status   Specimen Description BLOOD LEFT ANTECUBITAL  Final   Special Requests BOTTLES DRAWN AEROBIC AND ANAEROBIC 5CC  Final   Culture NO GROWTH 5 DAYS  Final   Report Status 12/09/2015 FINAL  Final  Culture, blood (Routine X 2) w Reflex to ID Panel     Status: None   Collection Time: 12/04/15  9:34 PM  Result  Value Ref Range Status   Specimen Description BLOOD RIGHT HAND  Final   Special Requests IN PEDIATRIC BOTTLE 2CC  Final   Culture NO GROWTH 5 DAYS  Final   Report Status 12/09/2015 FINAL  Final  Culture, Urine     Status: None   Collection Time: 12/05/15  6:58 AM  Result Value Ref Range Status   Specimen Description URINE, RANDOM  Final   Special Requests NONE  Final   Culture NO GROWTH  Final   Report Status 12/06/2015 FINAL  Final  MRSA PCR Screening     Status: None   Collection  Time: 12/05/15 11:38 AM  Result Value Ref Range Status   MRSA by PCR NEGATIVE NEGATIVE Final    Comment:        The GeneXpert MRSA Assay (FDA approved for NASAL specimens only), is one component of a comprehensive MRSA colonization surveillance program. It is not intended to diagnose MRSA infection nor to guide or monitor treatment for MRSA infections.      Studies: No results found.  Scheduled Meds: . pantoprazole  40 mg Oral Daily  . sodium chloride flush  10-40 mL Intracatheter Q12H  . spironolactone  25 mg Oral Daily   Continuous Infusions: . amiodarone 30 mg/hr (12/12/15 0551)  . DOBUTamine 5 mcg/kg/min (12/12/15 0200)  . furosemide (LASIX) infusion 15 mg/hr (12/12/15 1330)       Time spent: 25 min    [facility]  [facility] Pager [phone]. If 7PM-7AM, please contact night-coverage at [email or web address], Office  [phone]  password [email or web address] 12/12/2015, 2:37 PM  LOS: 8 days

## 2015-12-13 DIAGNOSIS — N5089 Other specified disorders of the male genital organs: Secondary | ICD-10-CM

## 2015-12-13 DIAGNOSIS — Z7189 Other specified counseling: Secondary | ICD-10-CM

## 2015-12-13 DIAGNOSIS — B370 Candidal stomatitis: Secondary | ICD-10-CM

## 2015-12-13 DIAGNOSIS — K5901 Slow transit constipation: Secondary | ICD-10-CM

## 2015-12-13 LAB — BASIC METABOLIC PANEL
Anion gap: 13 (ref 5–15)
BUN: 63 mg/dL — AB (ref 6–20)
CALCIUM: 8.6 mg/dL — AB (ref 8.9–10.3)
CO2: 36 mmol/L — AB (ref 22–32)
CREATININE: 2.8 mg/dL — AB (ref 0.61–1.24)
Chloride: 71 mmol/L — ABNORMAL LOW (ref 101–111)
GFR calc non Af Amer: 25 mL/min — ABNORMAL LOW (ref >60)
GFR, EST AFRICAN AMERICAN: 29 mL/min — AB (ref >60)
Glucose, Bld: 162 mg/dL — ABNORMAL HIGH (ref 65–99)
Potassium: 2.8 mmol/L — ABNORMAL LOW (ref 3.5–5.1)
SODIUM: 120 mmol/L — AB (ref 135–145)

## 2015-12-13 LAB — CARBOXYHEMOGLOBIN
CARBOXYHEMOGLOBIN: 2 % — AB (ref 0.5–1.5)
Methemoglobin: 0.6 % (ref 0.0–1.5)
O2 SAT: 68.8 %
TOTAL HEMOGLOBIN: 13.2 g/dL — AB (ref 13.5–18.0)

## 2015-12-13 MED ORDER — POTASSIUM CHLORIDE CRYS ER 20 MEQ PO TBCR
40.0000 meq | EXTENDED_RELEASE_TABLET | Freq: Three times a day (TID) | ORAL | Status: AC
  Administered 2015-12-13 (×3): 40 meq via ORAL
  Filled 2015-12-13 (×3): qty 2

## 2015-12-13 MED ORDER — MUSCLE RUB 10-15 % EX CREA
TOPICAL_CREAM | Freq: Two times a day (BID) | CUTANEOUS | Status: DC | PRN
  Filled 2015-12-13: qty 85

## 2015-12-13 MED ORDER — MAGIC MOUTHWASH
10.0000 mL | Freq: Four times a day (QID) | ORAL | Status: DC
  Administered 2015-12-14 – 2015-12-17 (×9): 10 mL via ORAL
  Filled 2015-12-13 (×19): qty 10

## 2015-12-13 MED ORDER — CETAPHIL MOISTURIZING EX LOTN
TOPICAL_LOTION | Freq: Two times a day (BID) | CUTANEOUS | Status: DC
  Administered 2015-12-13: 1 via TOPICAL
  Administered 2015-12-13: 14:00:00 via TOPICAL
  Administered 2015-12-14: 1 via TOPICAL
  Administered 2015-12-14 – 2015-12-15 (×2): via TOPICAL
  Administered 2015-12-15 – 2015-12-16 (×2): 1 via TOPICAL
  Administered 2015-12-16 – 2015-12-17 (×2): via TOPICAL
  Filled 2015-12-13: qty 473

## 2015-12-13 MED ORDER — PANTOPRAZOLE SODIUM 40 MG PO TBEC
40.0000 mg | DELAYED_RELEASE_TABLET | Freq: Two times a day (BID) | ORAL | Status: DC
  Administered 2015-12-13 – 2015-12-17 (×8): 40 mg via ORAL
  Filled 2015-12-13 (×8): qty 1

## 2015-12-13 MED ORDER — SENNOSIDES-DOCUSATE SODIUM 8.6-50 MG PO TABS: 2.0000 | ORAL_TABLET | Freq: Once | ORAL | Status: DC

## 2015-12-13 MED ORDER — TROLAMINE SALICYLATE 10 % EX CREA
TOPICAL_CREAM | Freq: Two times a day (BID) | CUTANEOUS | Status: DC | PRN
  Filled 2015-12-13: qty 85

## 2015-12-13 MED ORDER — SENNOSIDES-DOCUSATE SODIUM 8.6-50 MG PO TABS: 2.0000 | ORAL_TABLET | Freq: Every day | ORAL | Status: DC | PRN

## 2015-12-13 MED ORDER — POTASSIUM CHLORIDE CRYS ER 20 MEQ PO TBCR
40.0000 meq | EXTENDED_RELEASE_TABLET | Freq: Once | ORAL | Status: AC
  Administered 2015-12-13: 40 meq via ORAL
  Filled 2015-12-13: qty 2

## 2015-12-13 MED ORDER — ALUM & MAG HYDROXIDE-SIMETH 200-200-20 MG/5ML PO SUSP
30.0000 mL | Freq: Four times a day (QID) | ORAL | Status: DC | PRN
  Administered 2015-12-13 – 2015-12-17 (×2): 30 mL via ORAL
  Filled 2015-12-13 (×3): qty 30

## 2015-12-13 NOTE — Progress Notes (Signed)
Noted Palliative Care discussion with pt and family and concern that pt did not want to be a burden on family at time of death. Made SW aware of change in Disposition.

## 2015-12-13 NOTE — Progress Notes (Addendum)
[facility]  PROGRESS NOTE  Austin [name]:[id number] DOB: 1970-09-18 DOA: 12/04/2015 PCP: Aida Puffer, MD    Brief HPI:  45 year old male with a history of ongoing alcohol abuse, atrial fibrillation presented to the emergency department on 12/04/2015 with increasing lower extremity edema over several weeks and lower abdominal discomfort. The patient was found to have atrial fibrillation with RVR with initial hypotension with systolic blood pressure in the 80s. The patient was clinically fluid overloaded with anasarca and pulmonary edema. He was ultimately started on an amiodarone and lasix drip. Critical caremedicine was consulted.Cardiology was also consulted.Echocardiogram showed EF 35-40% with diffuse hypokinesis, moderate RV dilation and PAP 45. The patient was noted to have elevated procalcitonin and lactic acid and concerns for right lower lobe infiltrate. He was started on levofloxacin on 12/05/2015. Blood cultures and urine cultures have been negative. MRSA screen was negative. The patient remains critically ill with evidence of cardiac, hepatic, renal, and hematologic failure. Unfortunately, Patient has very poor insight into his clinical situation. Palliative care medicine was consulted, andthe patient and family continue to desirethe full scope of care.     Assessment/Plan:    1. Acute on chronic systolic CHF- patient has evidence of right and left heart failure, as per echo EF 35-40%. Diffuse hypokinesis, moderate right ventricular dilation, pulmonary artery pressure 45. Patient currently on furosemide drip per cardiology. Also on dobutamine as well as a more developed by cardiology. Good urine output  Patient to continue with current care, and if no improvement plan for full comfort care. 2. Paroxysmal atrial fibrillation with RVR- continue amiodarone drip, he is not a candidate for anticoagulation secondary to ongoing alcohol use and liver cirrhosis. Patient self  discontinued warfarin 2 years ago due to lack of insurance as he lost his job. 3. Hypokalemia- potassium being replaced. Check bmp in am. 4. Portal hypertension with suspected underlying liver cirrhosis- suspect liver cirrhosis  as initial bilirubin was 12.2, now slowly improved to 7.3. Secondary to alcohol abuse and right heart failure. INR 2.2 5. 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 Will follow BMP in a.m. 6. Hyponatremia- secondary to CHF and liver cirrhosis. Check BMP in a.m. 7. Goals of care- patient seen by palliative care, now DO NOT RESUSCITATE, no escalation of care.    DVT prophylaxis: INR > 2.0 Code Status: DNR Family Communication: No family at bedside Disposition Plan: To be decided   Consultants:  Cardiology  Palliative care  PCCM  Procedures:  Echocardiogram  Antibiotics:  None  Subjective    Patient seen and examined, feels better this morning. Denies chest pain or shortness of breath.  Objective    Objective: Vitals:   12/13/15 0416 12/13/15 0759 12/13/15 0800 12/13/15 1000  BP: (!) 77/64 (!) 84/63 (!) 84/63 (!) 82/69  Pulse: 98 98 98 66  Resp: 16 15 14 13   Temp: 97.9 F (36.6 C) 97.5 F (36.4 C)    TempSrc: Oral Oral    SpO2: 96% 95% 95% 96%  Weight: 93.4 kg (205 lb 14.6 oz)     Height:        Intake/Output Summary (Last 24 hours) at 12/13/15 1025 Last data filed at 12/13/15 1000  Gross per 24 hour  Intake          1875.82 ml  Output             3975 ml  Net         -  2099.18 ml   Filed Weights   12/11/15 0500 12/12/15 0325 12/13/15 0416  Weight: 96.5 kg (212 lb 11.9 oz) 94.8 kg (208 lb 15.9 oz) 93.4 kg (205 lb 14.6 oz)    Examination:  General exam: Appears calm and comfortable  Respiratory system: Clear to auscultation. Respiratory effort normal. Cardiovascular system: S1 & S2 heard, Irregular, no murmurs, rubs, gallops or clicks.b/l  2 +  pitting edema Gastrointestinal system: Abdomen is nondistended, soft and nontender. No organomegaly or masses felt. Normal bowel sounds heard. Central nervous system: Alert and oriented. No focal neurological deficits. Extremities: Symmetric 5 x 5 power.    Data Reviewed: I have personally reviewed following labs and imaging studies Basic Metabolic Panel:  Recent Labs Lab 12/08/15 0758  12/10/15 0355 12/10/15 1200 12/11/15 0525 12/12/15 0800 12/13/15 0345  NA 125*  < > 121* 120* 119* 119* 120*  K 3.6  < > 3.9 4.1 3.5 3.5 2.8*  CL 78*  < > 76* 74* 71* 72* 71*  CO2 27  < > 30 30 32 32 36*  GLUCOSE 104*  < > 129* 215* 208* 158* 162*  BUN 67*  < > 65* 66* 64* 69* 63*  CREATININE 3.14*  < > 3.39* 3.50* 3.32* 3.28* 2.80*  CALCIUM 9.0  < > 8.7* 8.5* 8.5* 8.3* 8.6*  MG 1.9  --   --   --   --   --   --   PHOS 5.1*  --   --   --   --   --   --   < > = values in this interval not displayed. Liver Function Tests:  Recent Labs Lab 12/06/15 1050 12/08/15 0758 12/10/15 1200 12/11/15 0525 12/12/15 0800  AST 48*  --  60* 55* 53*  ALT 19  --  ALKPHOS 91  --  84 84 77  BILITOT 8.6*  --  8.8* 7.9* 7.3*  PROT 5.6*  --  5.4* 5.3* 5.2*  ALBUMIN 2.7* 2.8* 2.6* 2.5* 2.3*   No results for input(s): LIPASE, AMYLASE in the last 168 hours.  Recent Labs Lab 12/09/15 0428  AMMONIA 26   CBC:  Recent Labs Lab 12/06/15 1050 12/08/15 0758 12/10/15 0355 12/11/15 0525 12/12/15 0819  WBC 8.4 8.9 9.2 8.0 8.8  NEUTROABS 6.4 6.5 7.4  --  7.9*  HGB 14.6 15.7 14.4 13.8 13.7  HCT 40.7 43.3 39.3 38.3* 38.1*  MCV 112.7* 111.6* 108.0* 109.4* 108.5*  PLT 107* 86* 57* 56* 48*     Recent Results (from the past 240 hour(s))  Culture, blood (Routine X 2) w Reflex to ID Panel     Status: None   Collection Time: 12/04/15  9:32 PM  Result Value Ref Range Status   Specimen Description BLOOD LEFT ANTECUBITAL  Final   Special Requests BOTTLES DRAWN AEROBIC AND ANAEROBIC 5CC  Final    Culture NO GROWTH 5 DAYS  Final   Report Status 12/09/2015 FINAL  Final  Culture, blood (Routine X 2) w Reflex to ID Panel     Status: None   Collection Time: 12/04/15  9:34 PM  Result Value Ref Range Status   Specimen Description BLOOD RIGHT HAND  Final   Special Requests IN PEDIATRIC BOTTLE [facility]  Final   Culture NO GROWTH 5 DAYS  Final   Report Status 12/09/2015 FINAL  Final  Culture, Urine     Status: None   Collection Time: 12/05/15  6:58 AM  Result Value  Ref Range Status   Specimen Description URINE, RANDOM  Final   Special Requests NONE  Final   Culture NO GROWTH  Final   Report Status 12/06/2015 FINAL  Final  MRSA PCR Screening     Status: None   Collection Time: 12/05/15 11:38 AM  Result Value Ref Range Status   MRSA by PCR NEGATIVE NEGATIVE Final    Comment:        The GeneXpert MRSA Assay (FDA approved for NASAL specimens only), is one component of a comprehensive MRSA colonization surveillance program. It is not intended to diagnose MRSA infection nor to guide or monitor treatment for MRSA infections.      Studies: No results found.  Scheduled Meds: . pantoprazole  40 mg Oral BID AC  . potassium chloride  40 mEq Oral TID  . potassium chloride  40 mEq Oral Once  . sodium chloride flush  10-40 mL Intracatheter Q12H  . spironolactone  25 mg Oral Daily   Continuous Infusions: . amiodarone 30 mg/hr (12/13/15 0800)  . DOBUTamine 5 mcg/kg/min (12/13/15 0800)  . furosemide (LASIX) infusion 20 mg/hr (12/13/15 0807)       Time spent: 25 min    [facility] Center[facility],Laterica Matarazzo S  [facility] Pager [phone]. If 7PM-7AM, please contact night-coverage at [email or web address], Office  [phone]  password [email or web address] 12/13/2015, 10:25 AM  LOS: 9 days

## 2015-12-13 NOTE — Progress Notes (Signed)
Daily Progress Note   Patient Name: Austin [name]       Date: 12/13/2015 DOB: [birth year]  Age: 45 y.o. MRN#: [id number] Attending Physician: Meredeth Ide, MD Primary Care Physician: Aida Puffer, MD Admit Date: 12/04/2015  Reason for Consultation/Follow-up: Establishing goals of care, Hospice Evaluation, Non pain symptom management and Psychosocial/spiritual support  Subjective: We discussed the plan of optimizing him medically and then discontinuing the IV medications keeping him here in the hospital in order to see if he is able to leave the hospital.  Per HF Team he may be medically optimized in the next 2-3 days.  Patient asks about going home.  He doesn't want to be a burden on his family.  We discussed hospice support.  His brother then indicated that having him at home would be too much on his parents.  Austin [name] discussed Residential Hospice with his brother (the patient).    Austin [name] would like to go ahead and speak with a representative from [facility] to get further information - planning for the best possible scenario.  Merrel spoke with me privately to ask about multiple symptom management issues:  Constipation, scrotal swelling, hip pain, anxiety, thrush in his mouth, and a fuzzy feeling in his head accompanied by pressure in his ears.   Length of Stay: 9  Current Medications: Scheduled Meds:  . cetaphil   Topical BID  . magic mouthwash  10 mL Oral QID  . pantoprazole  40 mg Oral BID AC  . potassium chloride  40 mEq Oral TID  . potassium chloride  40 mEq Oral Once  . senna-docusate  2 tablet Oral Once  . sodium chloride flush  10-40 mL Intracatheter Q12H  . spironolactone  25 mg Oral Daily    Continuous Infusions: . amiodarone 30 mg/hr (12/13/15 0800)  . DOBUTamine 5  mcg/kg/min (12/13/15 0800)  . furosemide (LASIX) infusion 20 mg/hr (12/13/15 0807)    PRN Meds: alum & mag hydroxide-simeth, calcium carbonate, levalbuterol, LORazepam, morphine injection, ondansetron **OR** ondansetron (ZOFRAN) IV, oxyCODONE-acetaminophen, senna-docusate, sodium chloride flush, trolamine salicylate  Physical Exam        Wd 45 yo male, pallor, appears older than stated age.  He is alert, but still drifts off to sleep quickly. ++ Jaundice rrr with  systolic murmur resp CTA no increased work of breathing.  Abdomen + Hepatomegaly, NT, +BS Extremities +Ted hose, 2- 3 +edema on the right.  Vital Signs: BP (!) 82/69   Pulse 66   Temp 97.5 F (36.4 C) (Oral)   Resp 13   Ht  (1.905 m)   Wt 93.4 kg (205 lb 14.6 oz)   SpO2 96%   BMI 25.74 kg/m  SpO2: SpO2: 96 % O2 Device: O2 Device: Nasal Cannula O2 Flow Rate: O2 Flow Rate (L/min): 2 L/min  Intake/output summary:   Intake/Output Summary (Last 24 hours) at 12/13/15 1130 Last data filed at 12/13/15 1100  Gross per 24 hour  Intake          1919.62 ml  Output             3975 ml  Net         -2055.38 ml   LBM: Last BM Date: 12/10/15 Baseline Weight: Weight: 92.1 kg (203 lb 0.7 oz) Most recent weight: Weight: 93.4 kg (205 lb 14.6 oz)       Palliative Assessment/Data:    Flowsheet Rows   Flowsheet Row Most Recent Value  Intake Tab  Referral Department  Hospitalist  Unit at Time of Referral  ICU  Palliative Care Primary Diagnosis  Cardiac  Date Notified  12/08/15  Palliative Care Type  New Palliative care  Reason for referral  Clarify Goals of Care, Psychosocial or Spiritual support  Date of Admission  12/04/15  Date first seen by Palliative Care  12/09/15  # of days Palliative referral response time  1 Day(s)  # of days IP prior to Palliative referral  4  Clinical Assessment  Palliative Performance Scale Score  30%  Pain Max last 24 hours  Not able to report  Pain Min Last 24 hours  Not able to  report  Dyspnea Max Last 24 Hours  Not able to report  Dyspnea Min Last 24 hours  Not able to report  Nausea Max Last 24 Hours  Not able to report  Nausea Min Last 24 Hours  Not able to report  Anxiety Max Last 24 Hours  Not able to report  Anxiety Min Last 24 Hours  Not able to report  Other Max Last 24 Hours  Not able to report  Psychosocial & Spiritual Assessment  Palliative Care Outcomes  Patient/Family meeting held?  Yes  Who was at the meeting?  parents  Palliative Care follow-up planned  Yes, Facility      Patient Active Problem List   Diagnosis Date Noted  . DNR (do not resuscitate)   . Arthralgia of both lower legs   . Acute on chronic systolic CHF (congestive heart failure) (HCC) 12/10/2015  . Acute respiratory failure with hypoxia (HCC) 12/10/2015  . Acute renal failure superimposed on stage 4 chronic kidney disease (HCC) 12/10/2015  . Portal hypertension (HCC) 12/10/2015  . Coagulopathy (HCC) 12/10/2015  . Hyponatremia 12/10/2015  . Lactic acidosis 12/10/2015  . Edema   . Alcohol abuse, unspecified   . Alcoholic liver damage (HCC)   . Atrial fibrillation (HCC)   . Other abnormal blood chemistry   . Anxiety state   . Goals of care, counseling/discussion   . Palliative care encounter   . Cardiogenic shock (HCC)   . Jaundice   . Elevated LFTs   . Atrial fibrillation with RVR (HCC) 12/04/2015  . Anasarca 12/04/2015  . Alcoholic liver disease (HCC) 12/04/2015  . ARF (acute renal failure) (HCC) 12/04/2015  . Alcohol abuse 12/04/2015  .  Acute kidney injury [facility](HCC)     Palliative Care Assessment & Plan   Patient Profile: 45 y.o. male  with past medical history of Atrial fibrillation, alcohol abuse, opioid use disorder, systolic heart failure admitted on 12/04/2015 with increasing lower extremity edema, abdominal discomfort. While in the emergency room he was found to be in atrial fibrillation with RVR. He also was very hypotensive requiring a fluid bolus. Since  admission he has become sicker and is now with multisystem failure: Worsening systolic heart function with an ejection fraction of 35%, kidney failure and now is on a dobutamine drip to maintain blood pressure. His last drink of alcohol was 2 days prior to admission  Assessment: Patient with a strong will to live.  However his liver, heart, and kidneys are failing him.  He does not want prolonged life support.  Austin PrayBarry is HCPOA.  Code status is DNR.  Will not escalate care at this point.    Patient's urine output has improved significantly over the past two days.  His creatinine is slightly improved as well.  He is enjoying his family visits and beginning to accept his likely prognosis.  Recommendations/Plan:  Continue current course of treatment.  Do not escalate care  DNR.  [name] = HCPOA  Low dose ativan PRN for anxiety.  Percocet and Morphine PRN q2 for pain and dyspnea  Heat, Aspercreme for hip / leg pain  Senna/S for constipation  Scrotal Sling per nursing  Magic Mouthwash - thrush   Code Status: DNR.   Prognosis:   Uncertain days to weeks once pressors/ IV gtts are discontinued.  Family understands this.    Discharge Planning:  hospital death vs residential hospice  Care plan was discussed with Dr. Gala RomneyBensimhon, brother- Austin PrayBarry and sister-Jody as well as the patient..  Thank you for allowing the Palliative Medicine Team to assist in the care of this patient.   Time In: 10:50 Time Out: 11:50 Total Time 35 Prolonged Time Billed no      Greater than 50%  of this time was spent counseling and coordinating care related to the above assessment and plan.  Algis DownsMarianne York, PA-C Palliative Medicine Pager: [phone]  Please contact Palliative Medicine Team phone at [phone] for questions and concerns.                                                                                                                                                                                                                Daily Progress Note   Patient Name: Austin [name]  Dietrich Pates       Date: 12/13/2015 DOB: [birth year]  Age: 45 y.o. MRN#: [id number] Attending Physician: Meredeth Ide, MD Primary Care Physician: Aida Puffer, MD Admit Date: 12/04/2015  Reason for Consultation/Follow-up: Establishing goals of care  Subjective: Patient complains of pain in his hips and legs.  He also complains of reflux.  States he is not feeling well after his bath this morning.  "I have not recovered from it".  I recommended that Mr. Towson change his code status to DNR as a code would be very traumatic for him and he would be very unlikely to survive it.  He agreed.  I made Mr. Afonso repeat this back to me to ensure he understood.  He states that he is not ready to die yet, but he would like his pain and reflux treated.  I agreed to treat his symptoms.  I discussed the change in code status with his brother Austin [name].  He accepted the change.  I recommended that we continue current care for another 24 - 48 hours and if there is no improvement we should strongly consider instituting strictly comfort measures.      Both Austin [name] and Elon asked that I speak with their father to update him.   Length of Stay: 9  Current Medications: Scheduled Meds:  . cetaphil   Topical BID  . magic mouthwash  10 mL Oral QID  . pantoprazole  40 mg Oral BID AC  . potassium chloride  40 mEq Oral TID  . potassium chloride  40 mEq Oral Once  . senna-docusate  2 tablet Oral Once  . sodium chloride flush  10-40 mL Intracatheter Q12H  . spironolactone  25 mg Oral Daily    Continuous Infusions: . amiodarone 30 mg/hr (12/13/15 0800)  . DOBUTamine 5 mcg/kg/min (12/13/15 0800)  . furosemide (LASIX) infusion 20 mg/hr (12/13/15 0807)    PRN Meds: alum & mag hydroxide-simeth, calcium carbonate, levalbuterol, LORazepam, morphine injection, ondansetron **OR** ondansetron (ZOFRAN) IV, oxyCODONE-acetaminophen, senna-docusate,  sodium chloride flush, trolamine salicylate  Physical Exam        Wd 45 yo male, pallor, appears older than stated age.  He is tremulous He drifts off to sleep easily. CV difficult to hear, rrr resp CTA no increased work of breathing. Abdomen + Hepatomegaly, NT, +BS Extremities +Ted hose, 3 +edema L>R   Vital Signs: BP (!) 82/69   Pulse 66   Temp 97.5 F (36.4 C) (Oral)   Resp 13   Ht 6\' 3"  (1.905 m)   Wt 93.4 kg (205 lb 14.6 oz)   SpO2 96%   BMI 25.74 kg/m  SpO2: SpO2: 96 % O2 Device: O2 Device: Nasal Cannula O2 Flow Rate: O2 Flow Rate (L/min): 2 L/min  Intake/output summary:   Intake/Output Summary (Last 24 hours) at 12/13/15 1130 Last data filed at 12/13/15 1100  Gross per 24 hour  Intake          1919.62 ml  Output             3975 ml  Net         -2055.38 ml   LBM: Last BM Date: 12/10/15 Baseline Weight: Weight: 92.1 kg (203 lb 0.7 oz) Most recent weight: Weight: 93.4 kg (205 lb 14.6 oz)       Palliative Assessment/Data:    Flowsheet Rows   Flowsheet Row Most Recent Value  Intake Tab  Referral Department  Hospitalist  Unit at Time of Referral  ICU  Palliative Care  Primary Diagnosis  Cardiac  Date Notified  12/08/15  Palliative Care Type  New Palliative care  Reason for referral  Clarify Goals of Care, Psychosocial or Spiritual support  Date of Admission  12/04/15  Date first seen by Palliative Care  12/09/15  # of days Palliative referral response time  1 Day(s)  # of days IP prior to Palliative referral  4  Clinical Assessment  Palliative Performance Scale Score  30%  Pain Max last 24 hours  Not able to report  Pain Min Last 24 hours  Not able to report  Dyspnea Max Last 24 Hours  Not able to report  Dyspnea Min Last 24 hours  Not able to report  Nausea Max Last 24 Hours  Not able to report  Nausea Min Last 24 Hours  Not able to report  Anxiety Max Last 24 Hours  Not able to report  Anxiety Min Last 24 Hours  Not able to report  Other Max Last 24  Hours  Not able to report  Psychosocial & Spiritual Assessment  Palliative Care Outcomes  Patient/Family meeting held?  Yes  Who was at the meeting?  parents  Palliative Care follow-up planned  Yes, Facility      Patient Active Problem List   Diagnosis Date Noted  . DNR (do not resuscitate)   . Arthralgia of both lower legs   . Acute on chronic systolic CHF (congestive heart failure) (HCC) 12/10/2015  . Acute respiratory failure with hypoxia (HCC) 12/10/2015  . Acute renal failure superimposed on stage 4 chronic kidney disease (HCC) 12/10/2015  . Portal hypertension (HCC) 12/10/2015  . Coagulopathy (HCC) 12/10/2015  . Hyponatremia 12/10/2015  . Lactic acidosis 12/10/2015  . Edema   . Alcohol abuse, unspecified   . Alcoholic liver damage (HCC)   . Atrial fibrillation (HCC)   . Other abnormal blood chemistry   . Anxiety state   . Goals of care, counseling/discussion   . Palliative care encounter   . Cardiogenic shock (HCC)   . Jaundice   . Elevated LFTs   . Atrial fibrillation with RVR (HCC) 12/04/2015  . Anasarca 12/04/2015  . Alcoholic liver disease (HCC) 12/04/2015  . ARF (acute renal failure) (HCC) 12/04/2015  . Alcohol abuse 12/04/2015  . Acute kidney injury [name])     Palliative Care Assessment & Plan   Patient Profile: 45 y.o. male  with past medical history of Atrial fibrillation, alcohol abuse, opioid use disorder, systolic heart failure admitted on 12/04/2015 with increasing lower extremity edema, abdominal discomfort. While in the emergency room he was found to be in atrial fibrillation with RVR. He also was very hypotensive requiring a fluid bolus. Since admission he has become sicker and is now with multisystem failure: Worsening systolic heart function with an ejection fraction of 35%, kidney failure and now is on a dobutamine drip to maintain blood pressure. His last drink of alcohol was 2 days prior to admission  Assessment: Patient with a strong will to live.   However his liver, heart, and kidneys are failing him.  He does not want prolonged life support.  He wants his brother, Austin [name] to be his HCPOA.  Code status has been changed to DNR.  Will not escalate care at this point.  Recommendations/Plan:  Continue current course of treatment.  Do not escalate care  Changed code status to DNR.  [name] = HCPOA  Low dose ativan PRN for anxiety.  Added Morphine PRN q2 for pain and dyspnea  Liberalized  diet to regular (family bringing in [employer] biscuits)  Heating pad for hip / leg pain.  Will continue to monitor progress and refine goals according to his progress.   Code Status: DNR.   Prognosis:   Uncertain but likely days to weeks.  Most likely hospital death.    Discharge Planning:  Most likely hospital death.  Care plan was discussed with brother, Austin [name].  Plan to meet with father later today.  Thank you for allowing the Palliative Medicine Team to assist in the care of this patient.   Time In: 8:45 Time Out: 9:15 Total Time 30 Prolonged Time Billed no      Greater than 50%  of this time was spent counseling and coordinating care related to the above assessment and plan.  Algis Downs, PA-C Palliative Medicine Pager: [phone]  Please contact Palliative Medicine Team phone at [phone] for questions and concerns.

## 2015-12-13 NOTE — Progress Notes (Signed)
Patient Name: Austin [name] Date of Encounter: 12/13/2015  45 yo man with ongoing alcohol abuse, systolic HF and history of AF treated admitted with HF, renal failure and liver failure.   SUBJECTIVE Palliative Care consulted 12/10/15. Pt is now DNR/DNI with no escalation of care.  Plan is to continue current treatment for 24-48 hours with consideration of full comfort care.   On Dobutamine 5 mcg. Todays CO-OX is 69%.  CVP 12-13. Creatinine coming down.   Complaining of indigestion. Denies SOB.   Echo shows moderate LV dysfunction - EF 35-40%. RV severely HK  Moderate PHTN Moderate - severe MR   CURRENT MEDS . pantoprazole  40 mg Oral Daily  . potassium chloride  40 mEq Oral TID  . sodium chloride flush  10-40 mL Intracatheter Q12H  . spironolactone  25 mg Oral Daily   Dobutamine 5 mcg  OBJECTIVE  Vitals:   12/12/15 2336 12/13/15 0000 12/13/15 0400 12/13/15 0416  BP: (!) 80/59 (!) 80/69 (!) 77/64 (!) 77/64  Pulse: 95 94 96 98  Resp: 12 (!) 21 (!) 23 16  Temp: 97.8 F (36.6 C)   97.9 F (36.6 C)  TempSrc: Oral   Oral  SpO2: 95% 96% 97% 96%  Weight:    205 lb 14.6 oz (93.4 kg)  Height:        Intake/Output Summary (Last 24 hours) at 12/13/15 0725 Last data filed at 12/13/15 0600  Gross per 24 hour  Intake           1291.2 ml  Output             3425 ml  Net          -2133.8 ml   Filed Weights   12/11/15 0500 12/12/15 0325 12/13/15 0416  Weight: 212 lb 11.9 oz (96.5 kg) 208 lb 15.9 oz (94.8 kg) 205 lb 14.6 oz (93.4 kg)    PHYSICAL EXAM CVP 12-13  General: Chronically ill appearing male.   Neuro: Drowsy, but alert and oriented x3 . Moves all extremities spontaneously. Psych: Flat affect. HEENT:  scleral icterus.Temporal wasting  Neck: Supple without bruits. JVP up to jaw  Lungs:  Clear dull at basese Heart: PMI laterally displaced irrg  +s3 3/6 systolic murmur radiating to the axilla and ULSB.  Abdomen: Soft, NT, mildly distended, no HSM. No bruits or  masses. +BS  Extremities: No clubbing, cyanosis. 3+ BLE  Edema into thighs. Ted hose in place.  DP/PT/Radials 2+ and equal bilaterally.  EKG: Reviewed, Afib 100-110s  Accessory Clinical Findings  CBC  Recent Labs  12/11/15 0525 12/12/15 0819  WBC 8.0 8.8  NEUTROABS  --  7.9*  HGB 13.8 13.7  HCT 38.3* 38.1*  MCV 109.4* 108.5*  PLT 56* 48*   Basic Metabolic Panel  Recent Labs  12/12/15 0800 12/13/15 0345  NA 119* 120*  K 3.5 2.8*  CL 72* 71*  CO2 32 36*  GLUCOSE 158* 162*  BUN 69* 63*  CREATININE 3.28* 2.80*  CALCIUM 8.3* 8.6*   Liver Function Tests  Recent Labs  12/11/15 0525 12/12/15 0800  AST 55* 53*  ALT 22 22  ALKPHOS 84 77  BILITOT 7.9* 7.3*  PROT 5.3* 5.2*  ALBUMIN 2.5* 2.3*   No results for input(s): LIPASE, AMYLASE in the last 72 hours. Cardiac Enzymes No results for input(s): CKTOTAL, CKMB, CKMBINDEX, TROPONINI in the last 72 hours. BNP Invalid input(s): POCBNP D-Dimer No results for input(s): DDIMER in the last 72 hours. Hemoglobin  A1C No results for input(s): HGBA1C in the last 72 hours. Fasting Lipid Panel No results for input(s): CHOL, HDL, LDLCALC, TRIG, CHOLHDL, LDLDIRECT in the last 72 hours. Thyroid Function Tests No results for input(s): TSH, T4TOTAL, T3FREE, THYROIDAB in the last 72 hours.  Invalid input(s): FREET3  TELE  afib at 90s   Radiology/Studies  Ct Abdomen Pelvis Wo Contrast  Result Date: 12/04/2015 CLINICAL DATA:  45 year old male with increased lower extremity swelling and abdominal pain. Jaundice. Initial encounter. EXAM: CT ABDOMEN AND PELVIS WITHOUT CONTRAST TECHNIQUE: Multidetector CT imaging of the abdomen and pelvis was performed following the standard protocol without IV contrast. COMPARISON:  CT chest abdomen and pelvis 05/05/2010. FINDINGS: New small to moderate bilateral layering pleural effusions with compressive lower lobe atelectasis. Cardiomegaly appears new since 2012. No pericardial effusion. No acute  osseous abnormality identified. Anasarca, with diffuse abdominal wall and dependent flank subcutaneous stranding. Moderate volume simple appearing free fluid in the pelvis. Small volume free fluid throughout the abdomen, including perihepatic and perisplenic fluid. Negative rectum and urinary bladder. Occasional sigmoid diverticula with no active inflammation. Decompressed left colon. Mild wall thickening of the transverse colon which might be reactive. More normal appearance of the right colon. Negative appendix. Decompressed terminal ileum. Oral contrast in the small bowel, but has not yet reached the TI. Small volume of oral contrast in the distal esophagus. Negative stomach. Decompressed duodenum. Mild hepatomegaly which is new since 2012. No discrete liver lesion identified in the absence of contrast. No intrahepatic or extrahepatic biliary ductal dilatation is evident on this noncontrast exam. Increased density within the gallbladder might be sludge. The main portal vein does not appear enlarged. Negative noncontrast spleen, no splenomegaly. Negative noncontrast pancreas and adrenal glands. Negative noncontrast kidneys. Aortoiliac calcified atherosclerosis noted. No lymphadenopathy identified. IMPRESSION: 1. Anasarca with abdominal and pelvic ascites and layering pleural effusions (small to moderate volume). 2. Cardiomegaly and hepatomegaly are new since 2012. No discrete liver lesion in the absence of contrast. No findings to suggest chronic portal hypertension. The top differential considerations in this clinical setting include heart failure with congestive hepatopathy versus acute or or chronic hepatitis. 3. Calcified aortic atherosclerosis. 4. Suggestion of gallbladder sludge. Electronically Signed   By: Odessa FlemingH  Hall M.D.   On: 12/04/2015 20:49   Ct Head Wo Contrast  Result Date: 12/07/2015 CLINICAL DATA:  Fall.  History of atrial fibrillation. EXAM: CT HEAD WITHOUT CONTRAST TECHNIQUE: Contiguous axial  images were obtained from the base of the skull through the vertex without intravenous contrast. COMPARISON:  Head CT dated 05/05/2010. FINDINGS: Brain: There is generalized parenchymal atrophy, moderate in degree for age, with commensurate dilatation of the ventricles and sulci. There is no mass, hemorrhage, edema or other evidence of acute parenchymal abnormality. No extra-axial hemorrhage. Vascular: No hyperdense vessel or unexpected calcification. Skull: Negative for fracture or focal lesion. Sinuses/Orbits: No acute findings. Other: None. IMPRESSION: 1. No acute findings.  No intracranial mass, hemorrhage or edema. 2. Atrophy. Electronically Signed   By: Bary RichardStan  Maynard M.D.   On: 12/07/2015 21:06   [place] Abdomen Complete  Result Date: 12/05/2015 CLINICAL DATA:  Patient with elevated LFTs. Anasarca. Alcohol abuse. EXAM: ABDOMEN ULTRASOUND COMPLETE COMPARISON:  CT abdomen pelvis 12/04/2015 FINDINGS: Gallbladder: Sludge within the gallbladder lumen. No gallbladder wall thickening or pericholecystic fluid. Negative sonographic Murphy's sign. Common bile duct: Diameter: 5 mm Liver: Diffusely increased in echogenicity. No focal lesion identified. IVC: No abnormality visualized. Pancreas: Visualized portion unremarkable. Spleen: Size and appearance within normal limits.  Right Kidney: Length: 12.0 cm. Echogenicity within normal limits. No mass or hydronephrosis visualized. Left Kidney: Length: 12.4 cm. Echogenicity within normal limits. No mass or hydronephrosis visualized. Abdominal aorta: No aneurysm visualized. Other findings: Diffuse ascites.  Bilateral pleural effusions. IMPRESSION: Gallbladder sludge. No secondary signs to suggest acute cholecystitis. Hepatic steatosis. Ascites. Bilateral pleural effusions. Electronically Signed   By: Annia Belt M.D.   On: 12/05/2015 17:16   Dg Chest Port 1 View  Addendum Date: 12/08/2015   ADDENDUM REPORT: 12/08/2015 20:03 ADDENDUM: The PICC line terminates in the central  SVC. Electronically Signed   By: Gerome Sam III M.D   On: 12/08/2015 20:03   Result Date: 12/08/2015 CLINICAL DATA:  Ongoing alcohol abuse. History of renal and liver failure. EXAM: PORTABLE CHEST 1 VIEW COMPARISON:  December 05, 2015 FINDINGS: No pneumothorax. Diffuse bilateral interstitial opacities consistent with edema. More focal opacity in the right base. Mild cardiomegaly. No other acute abnormalities. IMPRESSION: 1. Pulmonary edema. 2. More focal opacity in the right base could represent superimposed developing infiltrate or asymmetric edema. Recommend clinical correlation and follow-up to resolution. Electronically Signed: By: Gerome Sam III M.D On: 12/08/2015 15:54   Dg Chest Port 1 View  Result Date: 12/05/2015 CLINICAL DATA:  45 year old male with cough and anasarca EXAM: PORTABLE CHEST 1 VIEW COMPARISON:  CT dated 05/05/2010 FINDINGS: There is mild cardiomegaly with increased vascular and interstitial prominence compatible with congestive changes and mild interstitial edema. Superimposed pneumonia is not excluded. No focal consolidation, pleural effusion, or pneumothorax. No acute osseous pathology. IMPRESSION: Mild cardiomegaly and congestive changes.  No focal consolidation. Electronically Signed   By: Elgie Collard M.D.   On: 12/05/2015 00:39    ASSESSMENT AND PLAN   1. Shock with multisystem organ failure 2. Paroxysmal Afib with RVR - Hx of PAF. Self discontinued warfarin about 2 years ago due to lack of insurance as he lost his job.  3. Acute on chronic systolic HF with biventricular failure - EF 35-40% 4. Acute on chronic renal failure, stage IV 5. Hepatic failure with anasarca 6. ETOH abuse 7. Hyponatremia 8. Hypokalemia  8. DNR/DNI  Todays CO-OX is 69 on dobutamine to 5 mcg. CVP 13. Continue IV diuresis. Creatinine coming down. 3/28>2.8. No BB with cardiogenic shock.   Continue IV amio 30 mg per hour. Not good candidate for long-term amio of AC due to ETOH  cirrhosis and liver failure.   Palliative Care discussions ongoing. Now DNR/DNI with consideration for full comfort care.    Amy Clegg NP-C  7:25 AM    Patient seen and examined with Tonye Becket, NP. We discussed all aspects of the encounter. I agree with the assessment and plan as stated above.   More alert today and clinically improved on IV dobutamine. Diuresing well with lasix gtt but still markedly edematous. Remains in rate controlled AF on amio. His MELD score is 35.   He is not candidate for home inotropes. I think the best plan is to continue dobutamine for a couple more days and optimize his volume status. We will then wean dobutamine to off and switch him to oral diuretics. While I don't think this progress will be sustainable in the long run, I do think it will give his caretakers the best chance at keep his fluid off and keeping him comfortable at least in the short term. I do think getting him back in NSR but be useful but he is not candidate for anticoagulation and thus this is  prohibitive. Hopefully we will have him diuresed and off inotropes in the next 2-3 days.   Case d/w primary team and Palliative Care. Consider PT to mobilize him a bit.   Bryndon Cumbie,MD 10:52 PM

## 2015-12-13 NOTE — Progress Notes (Signed)
   12/13/15 1100  Clinical Encounter Type  Visited With Patient and family together  Visit Type Follow-up  Referral From Chaplain  Consult/Referral To Chaplain  CHP followed up with patient who is doing much better. CHP available as needed. Rodney BoozeGail L Tanylah Schnoebelen 12/13/15

## 2015-12-13 NOTE — Progress Notes (Signed)
CSW spoke with patient's father and brother. [facility]. Second choice is [facility]. [facility]  Osborne Cascoadia Jerrold Haskell LCSWA [phone]

## 2015-12-13 NOTE — Care Management Note (Addendum)
Case Management Note  Patient Details  Name: Austin [name] MRN: [id number] Date of Birth: 1970-12-13  Subjective/Objective:  45 y.o. [name] admitted  12/04/2015 with ongoing ETOH abuse. Currently undergoing treatment for Cardiac, Hepatic, Renal and hematologic failure. Plan is to continue current  Treatment x 24-48 hrs with  consideration of full comfort care.                 Action/Plan: CSW on board for facility placement if this is the wish of the family although they have expressed a desire to discharge home with Hospice services when the end nears. Plan to give list of agencies that provide Hospice services to the family today so they can be aware of options.  Expected Discharge Date:                  Expected Discharge Plan:  Home w Home Health Services  In-House Referral:  Clinical Social Work, Hospice / Palliative Care  Discharge planning Services  CM Consult  Post Acute Care Choice:  Hospice Choice offered to:  Patient, Parent  DME Arranged:    DME Agency:     HH Arranged:    HH Agency:     Status of Service:  In process, will continue to follow  If discussed at Long Length of Stay Meetings, dates discussed:  12/13/2015  Additional Comments:  Austin [name], Austin Silveri M, RN 12/13/2015, 9:53 AM

## 2015-12-14 LAB — BASIC METABOLIC PANEL
ANION GAP: 12 (ref 5–15)
BUN: 54 mg/dL — ABNORMAL HIGH (ref 6–20)
CALCIUM: 8.6 mg/dL — AB (ref 8.9–10.3)
CHLORIDE: 71 mmol/L — AB (ref 101–111)
CO2: 39 mmol/L — AB (ref 22–32)
CREATININE: 2.14 mg/dL — AB (ref 0.61–1.24)
GFR calc non Af Amer: 35 mL/min — ABNORMAL LOW (ref >60)
GFR, EST AFRICAN AMERICAN: 41 mL/min — AB (ref >60)
Glucose, Bld: 129 mg/dL — ABNORMAL HIGH (ref 65–99)
Potassium: 2.9 mmol/L — ABNORMAL LOW (ref 3.5–5.1)
SODIUM: 122 mmol/L — AB (ref 135–145)

## 2015-12-14 LAB — MAGNESIUM: MAGNESIUM: 1.6 mg/dL — AB (ref 1.7–2.4)

## 2015-12-14 LAB — CARBOXYHEMOGLOBIN
CARBOXYHEMOGLOBIN: 2 % — AB (ref 0.5–1.5)
Methemoglobin: 0.5 % (ref 0.0–1.5)
O2 SAT: 63.9 %
Total hemoglobin: 12.7 g/dL — ABNORMAL LOW (ref 13.5–18.0)

## 2015-12-14 MED ORDER — DOBUTAMINE IN D5W 4-5 MG/ML-% IV SOLN
2.5000 ug/kg/min | INTRAVENOUS | Status: DC
  Administered 2015-12-15: 4 ug/kg/min via INTRAVENOUS
  Filled 2015-12-14: qty 250

## 2015-12-14 MED ORDER — POTASSIUM CHLORIDE CRYS ER 20 MEQ PO TBCR
40.0000 meq | EXTENDED_RELEASE_TABLET | Freq: Four times a day (QID) | ORAL | Status: AC
  Administered 2015-12-14 (×2): 40 meq via ORAL
  Filled 2015-12-14 (×2): qty 2

## 2015-12-14 MED ORDER — POTASSIUM CHLORIDE CRYS ER 20 MEQ PO TBCR
40.0000 meq | EXTENDED_RELEASE_TABLET | ORAL | Status: AC
  Administered 2015-12-14 (×2): 40 meq via ORAL
  Filled 2015-12-14 (×2): qty 2

## 2015-12-14 MED ORDER — MAGNESIUM SULFATE 4 GM/100ML IV SOLN
4.0000 g | Freq: Once | INTRAVENOUS | Status: AC
Start: 2015-12-14 — End: 2015-12-14
  Administered 2015-12-14: 4 g via INTRAVENOUS
  Filled 2015-12-14: qty 100

## 2015-12-14 NOTE — Evaluation (Signed)
Physical Therapy Evaluation Patient Details Name: Austin [name] MRN: [id number] DOB: 03/09/1971 Today's Date: 12/14/2015   History of Present Illness  45 y.o. male  with past medical history of Atrial fibrillation, alcohol abuse, opioid use disorder, systolic heart failure admitted on 12/04/2015 with increasing lower extremity edema, abdominal discomfort. While in the emergency room he was found to be in atrial fibrillation with RVR. He also was very hypotensive requiring a fluid bolus. Since admission he has become sicker and is now with multisystem failure: Worsening systolic heart function with an ejection fraction of 35%, kidney failure and now is on a dobutamine drip to maintain blood pressure. His last drink of alcohol was 2 days prior to admission    Clinical Impression  Pt admitted with above diagnosis. Patient working with Palliative Care/Hospice team with [facility]. Limited to bed level evaluation (per patient wishes), however did educate patient on bed level exercises and positioning he can do to decr stiffness/soreness. Patient very appreciative. Patient reports desire to work on getting up to a chair, however not today due to fatigue. Will follow and assist with patient needs as able.  Pt currently with functional limitations due to the deficits listed below (see PT Problem List).        Follow Up Recommendations No PT follow up    Equipment Recommendations  None recommended by PT    Recommendations for Other Services       Precautions / Restrictions Precautions Precautions: Fall Precaution Comments: watch BP--weaning dobutamine      Mobility  Bed Mobility Overal bed mobility: Modified Independent             General bed mobility comments: rolled with rail; pushed up to reclined on elbows to scoot himself over to rt side of bed  Transfers                 General transfer comment: pt deferred attempt at EOB or OOB; nursing reports  [facility]  Ambulation/Gait                Stairs            Wheelchair Mobility    Modified Rankin (Stroke Patients Only)       Balance                                             Pertinent Vitals/Pain Pain Assessment: Faces Faces Pain Scale: Hurts little more Pain Location: Lt>Rt hip/sciatica Pain Descriptors / Indicators: Aching Pain Intervention(s): Limited activity within patient's tolerance;Monitored during session;Repositioned;Other (comment) (stretches for lt sciatica; positioned on left side)    Home Living Family/patient expects to be discharged to:: Hospice/Palliative care [facility]                      Prior Function Level of Independence: Needs assistance   Gait / Transfers Assistance Needed: walked with RW           Hand Dominance        Extremity/Trunk Assessment   Upper Extremity Assessment: Generalized weakness (+edema)           Lower Extremity Assessment: Generalized weakness (+++edema)         Communication      Cognition Arousal/Alertness: Awake/alert Behavior During Therapy: Flat affect Overall Cognitive Status:  Within Functional Limits for tasks assessed                      General Comments General comments (skin integrity, edema, etc.): Pt reported he feels stiff and uncomfortable from being in bed (although deferred attempt to get OOB). Asked for exercises/stretching to ease stiffness.     Exercises Low Level/ICU Exercises Ankle Circles/Pumps: AROM;Both;10 reps Quad Sets: AROM;Both;5 reps Heel Slides: AAROM;Both;Other reps (comment) (3) Other Exercises Other Exercises: Performed PROM lt hip for sciatic/gluteal stretch--bending lt knee up and across midline towards Rt shoulder x 30 seconds Other Exercises: Assisted into sidelying position with pillows for support of RLE (on left side)      Assessment/Plan    PT Assessment Patient needs  continued PT services  PT Diagnosis Generalized weakness   PT Problem List Decreased strength;Decreased range of motion;Decreased activity tolerance;Decreased mobility;Decreased knowledge of use of DME;Cardiopulmonary status limiting activity;Pain  PT Treatment Interventions DME instruction;Functional mobility training;Therapeutic activities;Therapeutic exercise;Patient/family education   PT Goals (Current goals can be found in the Care Plan section) Acute Rehab PT Goals Patient Stated Goal: to feel more mobile; less stiff PT Goal Formulation: With patient Time For Goal Achievement: 12/21/15 Potential to Achieve Goals: Fair    Frequency Min 2X/week   Barriers to discharge        Co-evaluation               End of Session Equipment Utilized During Treatment: Oxygen Activity Tolerance: Patient limited by fatigue Patient left: in bed;with call bell/phone within reach;with bed alarm set;with family/visitor present Nurse Communication: Other (comment) (ROM for stretching; would like to sleep on his side)         Time: 7829-5621 PT Time Calculation (min) (ACUTE ONLY): 25 min   Charges:   PT Evaluation $PT Eval Low Complexity: 1 Procedure PT Treatments $Therapeutic Exercise: 8-22 mins   PT G Codes:        Austin [name] 2015-12-29, 5:38 PM Pager [phone]

## 2015-12-14 NOTE — Progress Notes (Signed)
[facility]  PROGRESS NOTE  Austin [name] XLK:[id number] DOB: [birth year] DOA: 12/04/2015 PCP: Aida Puffer, MD    Brief HPI:  45 year old male with a history of ongoing alcohol abuse, atrial fibrillation presented to the emergency department on 12/04/2015 with increasing lower extremity edema over several weeks and lower abdominal discomfort. The patient was found to have atrial fibrillation with RVR with initial hypotension with systolic blood pressure in the 80s. The patient was clinically fluid overloaded with anasarca and pulmonary edema. He was ultimately started on an amiodarone and lasix drip. Critical caremedicine was consulted.Cardiology was also consulted.Echocardiogram showed EF 35-40% with diffuse hypokinesis, moderate RV dilation and PAP 45. The patient was noted to have elevated procalcitonin and lactic acid and concerns for right lower lobe infiltrate. He was started on levofloxacin on 12/05/2015. Blood cultures and urine cultures have been negative. MRSA screen was negative. The patient remains critically ill with evidence of cardiac, hepatic, renal, and hematologic failure. Unfortunately, Patient has very poor insight into his clinical situation. Palliative care medicine was consulted, andthe patient and family continue to desirethe full scope of care.     Assessment/Plan:    1. Acute on chronic systolic CHF- patient has evidence of right and left heart failure, as per echo EF 35-40%. Diffuse hypokinesis, moderate right ventricular dilation, pulmonary artery pressure 45. Patient currently on furosemide drip per cardiology. Also on dobutamine as well. Good urine output  Patient to continue with current care, and if no improvement plan for full comfort care. 2. Paroxysmal atrial fibrillation with RVR- continue amiodarone drip, he is not a candidate for anticoagulation secondary to ongoing alcohol use and liver cirrhosis. Patient self discontinued warfarin 2 years ago due  to lack of insurance as he lost his job. 3. Hypokalemia- potassium being replaced. Check bmp in am. 4. Portal hypertension with suspected underlying liver cirrhosis- suspect liver cirrhosis  as initial bilirubin was 12.2, now slowly improved to 7.3. Secondary to alcohol abuse and right heart failure. INR 2.2 5. 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 Will follow BMP in a.m. 6. Hyponatremia- secondary to CHF and liver cirrhosis. Check BMP in a.m. 7. Goals of care- patient seen by palliative care, now DO NOT RESUSCITATE, no escalation of care.    DVT prophylaxis: INR > 2.0 Code Status: DNR Family Communication: No family at bedside Disposition Plan: To be decided   Consultants:  Cardiology  Palliative care  PCCM  Procedures:  Echocardiogram  Antibiotics:  None  Subjective    Patient seen and examined, feels better this morning. Denies chest pain or shortness of breath. Good urine output with IV lasix and  Dobutamine.  Objective    Objective: Vitals:   12/14/15 0857 12/14/15 0900 12/14/15 1200 12/14/15 1242  BP: (!) 85/70 (!) 85/70  (!) 80/71  Pulse: (!) 103 89 88 87  Resp: Temp: 97.5 F (36.4 C)   97.6 F (36.4 C)  TempSrc: Oral   Oral  SpO2: 95% 96% 98% 95%  Weight:      Height:        Intake/Output Summary (Last 24 hours) at 12/14/15 1329 Last data filed at 12/14/15 1200  Gross per 24 hour  Intake       Austin GUEDESl  Output             4125 ml  Net          -  2270.7 ml   Filed Weights   12/12/15 0325 12/13/15 0416 12/14/15 0350  Weight: 94.8 kg (208 lb 15.9 oz) 93.4 kg (205 lb 14.6 oz) 93.8 kg (206 lb 12.7 oz)    Examination:  General exam: Appears calm and comfortable  Respiratory system: Clear to auscultation. Respiratory effort normal. Cardiovascular system: S1 & S2 heard, Irregular, no murmurs, rubs, gallops or clicks.b/l  2 + pitting  edema Gastrointestinal system: Abdomen is nondistended, soft and nontender. No organomegaly or masses felt. Normal bowel sounds heard. Central nervous system: Alert and oriented. No focal neurological deficits. Extremities: Symmetric 5 x 5 power.    Data Reviewed: I have personally reviewed following labs and imaging studies Basic Metabolic Panel:  Recent Labs Lab 12/08/15 0758  12/10/15 1200 12/11/15 0525 12/12/15 0800 12/13/15 0345 12/14/15 0430  NA 125*  < > 120* 119* 119* 120* 122*  K 3.6  < > 4.1 3.5 3.5 2.8* 2.9*  CL 78*  < > 74* 71* 72* 71* 71*  CO2 27  < > 30 32 32 36* 39*  GLUCOSE 104*  < > 215* 208* 158* 162* 129*  BUN 67*  < > 66* 64* 69* 63* 54*  CREATININE 3.14*  < > 3.50* 3.32* 3.28* 2.80* 2.14*  CALCIUM 9.0  < > 8.5* 8.5* 8.3* 8.6* 8.6*  MG 1.9  --   --   --   --   --  1.6*  PHOS 5.1*  --   --   --   --   --   --   < > = values in this interval not displayed. Liver Function Tests:  Recent Labs Lab 12/08/15 0758 12/10/15 1200 12/11/15 0525 12/12/15 0800  AST  --  60* 55* 53*  ALT  --  20 22 22   ALKPHOS  --  84 84 77  BILITOT  --  8.8* 7.9* 7.3*  PROT  --  5.4* 5.3* 5.2*  ALBUMIN 2.8* 2.6* 2.5* 2.3*   No results for input(s): LIPASE, AMYLASE in the last 168 hours.  Recent Labs Lab 12/09/15 0428  AMMONIA 26   CBC:  Recent Labs Lab 12/08/15 0758 12/10/15 0355 12/11/15 0525 12/12/15 0819  WBC 8.9 9.2 8.0 8.8  NEUTROABS 6.5 7.4  --  7.9*  HGB 15.7 14.4 13.8 13.7  HCT 43.3 39.3 38.3* 38.1*  MCV 111.6* 108.0* 109.4* 108.5*  PLT 86* 57* 56* 48*     Recent Results (from the past 240 hour(s))  Culture, blood (Routine X 2) w Reflex to ID Panel     Status: None   Collection Time: 12/04/15  9:32 PM  Result Value Ref Range Status   Specimen Description BLOOD LEFT ANTECUBITAL  Final   Special Requests BOTTLES DRAWN AEROBIC AND ANAEROBIC 5CC  Final   Culture NO GROWTH 5 DAYS  Final   Report Status 12/09/2015 FINAL  Final  Culture, blood  (Routine X 2) w Reflex to ID Panel     Status: None   Collection Time: 12/04/15  9:34 PM  Result Value Ref Range Status   Specimen Description BLOOD RIGHT HAND  Final   Special Requests IN PEDIATRIC BOTTLE 2CC  Final   Culture NO GROWTH 5 DAYS  Final   Report Status 12/09/2015 FINAL  Final  Culture, Urine     Status: None   Collection Time: 12/05/15  6:58 AM  Result Value Ref Range Status   Specimen Description URINE, RANDOM  Final   Special Requests NONE  Final  Culture NO GROWTH  Final   Report Status 12/06/2015 FINAL  Final  MRSA PCR Screening     Status: None   Collection Time: 12/05/15 11:38 AM  Result Value Ref Range Status   MRSA by PCR NEGATIVE NEGATIVE Final    Comment:        The GeneXpert MRSA Assay (FDA approved for NASAL specimens only), is one component of a comprehensive MRSA colonization surveillance program. It is not intended to diagnose MRSA infection nor to guide or monitor treatment for MRSA infections.      Studies: No results found.  Scheduled Meds: . cetaphil   Topical BID  . magic mouthwash  10 mL Oral QID  . pantoprazole  40 mg Oral BID AC  . potassium chloride  40 mEq Oral Q6H  . senna-docusate  2 tablet Oral Once  . sodium chloride flush  10-40 mL Intracatheter Q12H  . spironolactone  25 mg Oral Daily   Continuous Infusions: . amiodarone 30 mg/hr (12/14/15 0800)  . DOBUTamine 4 mcg/kg/min (12/14/15 0800)  . furosemide (LASIX) infusion 20 mg/hr (12/13/15 2126)       Time spent: 25 min    [facility],Shayleigh Bouldin S  [facility] Pager [phone]. If 7PM-7AM, please contact night-coverage at [email or web address], Office  [phone]  password [email or web address] 12/14/2015, 1:29 PM  LOS: 10 days

## 2015-12-14 NOTE — Progress Notes (Signed)
Patient Name: Austin [name] Date of Encounter: 12/14/2015  45 yo man with ongoing alcohol abuse, systolic HF and history of AF treated admitted with HF, renal failure and liver failure.   SUBJECTIVE Palliative Care consulted 12/10/15. Pt is now DNR/DNI with no escalation of care.  Plan is to continue current treatment for 24-48 hours with consideration of full comfort care.   On Dobutamine 5 mcg. Todays CO-OX is 64%.  CVP 9-10. Creatinine coming down.   Denies SOB. More alert. Feeling better.   Echo shows moderate LV dysfunction - EF 35-40%. RV severely HK  Moderate PHTN Moderate - severe MR   CURRENT MEDS . cetaphil   Topical BID  . magic mouthwash  10 mL Oral QID  . pantoprazole  40 mg Oral BID AC  . potassium chloride  40 mEq Oral Q4H  . senna-docusate  2 tablet Oral Once  . sodium chloride flush  10-40 mL Intracatheter Q12H  . spironolactone  25 mg Oral Daily   Dobutamine 5 mcg  OBJECTIVE  Vitals:   12/14/15 0200 12/14/15 0350 12/14/15 0400 12/14/15 0600  BP: (!) 86/68 (!) 88/71 (!) 86/69 (!) 89/69  Pulse: 91 94 93 90  Resp: (!) Temp:  97.9 F (36.6 C)    TempSrc:  Oral    SpO2: 96% 98% 96% 97%  Weight:  206 lb 12.7 oz (93.8 kg)    Height:        Intake/Output Summary (Last 24 hours) at 12/14/15 0749 Last data filed at 12/14/15 0700  Gross per 24 hour  Intake          2404.42 ml  Output             4125 ml  Net         -1720.58 ml   Filed Weights   12/12/15 0325 12/13/15 0416 12/14/15 0350  Weight: 208 lb 15.9 oz (94.8 kg) 205 lb 14.6 oz (93.4 kg) 206 lb 12.7 oz (93.8 kg)    PHYSICAL EXAM CVP 9-10 General: Chronically ill appearing male.   Neuro: Alert and oriented x3 . Moves all extremities spontaneously. Psych: Flat affect. HEENT:  scleral icterus.Temporal wasting  Neck: Supple without bruits. JVP 9-10  Lungs:  Clear dull at basese Heart: PMI laterally displaced irrg  +s3 3/6 systolic murmur radiating to the axilla and ULSB.    Abdomen: Soft, NT, mildly distended, no HSM. No bruits or masses. +BS  Extremities: No clubbing, cyanosis. 1+ BLE  Edema.  Ted hose in place.  DP/PT/Radials 2+ and equal bilaterally.  EKG: Reviewed, Afib/A flutter 90s  Accessory Clinical Findings  CBC  Recent Labs  12/12/15 0819  WBC 8.8  NEUTROABS 7.9*  HGB 13.7  HCT 38.1*  MCV 108.5*  PLT 48*   Basic Metabolic Panel  Recent Labs  12/13/15 0345 12/14/15 0430  NA 120* 122*  K 2.8* 2.9*  CL 71* 71*  CO2 36* 39*  GLUCOSE 162* 129*  BUN 63* 54*  CREATININE 2.80* 2.14*  CALCIUM 8.6* 8.6*  MG  --  1.6*   Liver Function Tests  Recent Labs  12/12/15 0800  AST 53*  ALT 22  ALKPHOS 77  BILITOT 7.3*  PROT 5.2*  ALBUMIN 2.3*   No results for input(s): LIPASE, AMYLASE in the last 72 hours. Cardiac Enzymes No results for input(s): CKTOTAL, CKMB, CKMBINDEX, TROPONINI in the last 72 hours. BNP Invalid input(s): POCBNP D-Dimer No results for input(s): DDIMER in the  last 72 hours. Hemoglobin A1C No results for input(s): HGBA1C in the last 72 hours. Fasting Lipid Panel No results for input(s): CHOL, HDL, LDLCALC, TRIG, CHOLHDL, LDLDIRECT in the last 72 hours. Thyroid Function Tests No results for input(s): TSH, T4TOTAL, T3FREE, THYROIDAB in the last 72 hours.  Invalid input(s): FREET3  TELE  afib at 90s   Radiology/Studies  Ct Abdomen Pelvis Wo Contrast  Result Date: 12/04/2015 CLINICAL DATA:  45 year old male with increased lower extremity swelling and abdominal pain. Jaundice. Initial encounter. EXAM: CT ABDOMEN AND PELVIS WITHOUT CONTRAST TECHNIQUE: Multidetector CT imaging of the abdomen and pelvis was performed following the standard protocol without IV contrast. COMPARISON:  CT chest abdomen and pelvis 05/05/2010. FINDINGS: New small to moderate bilateral layering pleural effusions with compressive lower lobe atelectasis. Cardiomegaly appears new since 2012. No pericardial effusion. No acute osseous  abnormality identified. Anasarca, with diffuse abdominal wall and dependent flank subcutaneous stranding. Moderate volume simple appearing free fluid in the pelvis. Small volume free fluid throughout the abdomen, including perihepatic and perisplenic fluid. Negative rectum and urinary bladder. Occasional sigmoid diverticula with no active inflammation. Decompressed left colon. Mild wall thickening of the transverse colon which might be reactive. More normal appearance of the right colon. Negative appendix. Decompressed terminal ileum. Oral contrast in the small bowel, but has not yet reached the TI. Small volume of oral contrast in the distal esophagus. Negative stomach. Decompressed duodenum. Mild hepatomegaly which is new since 2012. No discrete liver lesion identified in the absence of contrast. No intrahepatic or extrahepatic biliary ductal dilatation is evident on this noncontrast exam. Increased density within the gallbladder might be sludge. The main portal vein does not appear enlarged. Negative noncontrast spleen, no splenomegaly. Negative noncontrast pancreas and adrenal glands. Negative noncontrast kidneys. Aortoiliac calcified atherosclerosis noted. No lymphadenopathy identified. IMPRESSION: 1. Anasarca with abdominal and pelvic ascites and layering pleural effusions (small to moderate volume). 2. Cardiomegaly and hepatomegaly are new since 2012. No discrete liver lesion in the absence of contrast. No findings to suggest chronic portal hypertension. The top differential considerations in this clinical setting include heart failure with congestive hepatopathy versus acute or or chronic hepatitis. 3. Calcified aortic atherosclerosis. 4. Suggestion of gallbladder sludge. Electronically Signed   By: Odessa Fleming M.D.   On: 12/04/2015 20:49   Ct Head Wo Contrast  Result Date: 12/07/2015 CLINICAL DATA:  Fall.  History of atrial fibrillation. EXAM: CT HEAD WITHOUT CONTRAST TECHNIQUE: Contiguous axial images were  obtained from the base of the skull through the vertex without intravenous contrast. COMPARISON:  Head CT dated 05/05/2010. FINDINGS: Brain: There is generalized parenchymal atrophy, moderate in degree for age, with commensurate dilatation of the ventricles and sulci. There is no mass, hemorrhage, edema or other evidence of acute parenchymal abnormality. No extra-axial hemorrhage. Vascular: No hyperdense vessel or unexpected calcification. Skull: Negative for fracture or focal lesion. Sinuses/Orbits: No acute findings. Other: None. IMPRESSION: 1. No acute findings.  No intracranial mass, hemorrhage or edema. 2. Atrophy. Electronically Signed   By: Bary Richard M.D.   On: 12/07/2015 21:06   US Abdomen Complete  Result Date: 12/05/2015 CLINICAL DATA:  Patient with elevated LFTs. Anasarca. Alcohol abuse. EXAM: ABDOMEN ULTRASOUND COMPLETE COMPARISON:  CT abdomen pelvis 12/04/2015 FINDINGS: Gallbladder: Sludge within the gallbladder lumen. No gallbladder wall thickening or pericholecystic fluid. Negative sonographic Murphy's sign. Common bile duct: Diameter: 5 mm Liver: Diffusely increased in echogenicity. No focal lesion identified. IVC: No abnormality visualized. Pancreas: Visualized portion unremarkable. Spleen: Size and  appearance within normal limits. Right Kidney: Length: 12.0 cm. Echogenicity within normal limits. No mass or hydronephrosis visualized. Left Kidney: Length: 12.4 cm. Echogenicity within normal limits. No mass or hydronephrosis visualized. Abdominal aorta: No aneurysm visualized. Other findings: Diffuse ascites.  Bilateral pleural effusions. IMPRESSION: Gallbladder sludge. No secondary signs to suggest acute cholecystitis. Hepatic steatosis. Ascites. Bilateral pleural effusions. Electronically Signed   By: Annia Belt M.D.   On: 12/05/2015 17:16   Dg Chest Port 1 View  Addendum Date: 12/08/2015   ADDENDUM REPORT: 12/08/2015 20:03 ADDENDUM: The PICC line terminates in the central SVC.  Electronically Signed   By: Gerome Sam III M.D   On: 12/08/2015 20:03   Result Date: 12/08/2015 CLINICAL DATA:  Ongoing alcohol abuse. History of renal and liver failure. EXAM: PORTABLE CHEST 1 VIEW COMPARISON:  December 05, 2015 FINDINGS: No pneumothorax. Diffuse bilateral interstitial opacities consistent with edema. More focal opacity in the right base. Mild cardiomegaly. No other acute abnormalities. IMPRESSION: 1. Pulmonary edema. 2. More focal opacity in the right base could represent superimposed developing infiltrate or asymmetric edema. Recommend clinical correlation and follow-up to resolution. Electronically Signed: By: Gerome Sam III M.D On: 12/08/2015 15:54   Dg Chest Port 1 View  Result Date: 12/05/2015 CLINICAL DATA:  45 year old male with cough and anasarca EXAM: PORTABLE CHEST 1 VIEW COMPARISON:  CT dated 05/05/2010 FINDINGS: There is mild cardiomegaly with increased vascular and interstitial prominence compatible with congestive changes and mild interstitial edema. Superimposed pneumonia is not excluded. No focal consolidation, pleural effusion, or pneumothorax. No acute osseous pathology. IMPRESSION: Mild cardiomegaly and congestive changes.  No focal consolidation. Electronically Signed   By: Elgie Collard M.D.   On: 12/05/2015 00:39    ASSESSMENT AND PLAN   1. Shock with multisystem organ failure 2. Paroxysmal Afib with RVR - Hx of PAF. Self discontinued warfarin about 2 years ago due to lack of insurance as he lost his job.  3. Acute on chronic systolic HF with biventricular failure - EF 35-40% 4. Acute on chronic renal failure, stage IV 5. Hepatic failure with anasarca 6. ETOH abuse 7. Hyponatremia 8. Hypokalemia  8. DNR/DNI 9. Hypomagnesium  Todays CO-OX is 64% on dobutamine to 5 mcg. Cut dobutamine to 4 mcg. CVP down to 9-10. Continue IV diuresis one more day then transition to torsemide 100 mg daily . Creatinine coming down. 3/28>2.8>2.1. Replacing K. No BB  with cardiogenic shock.   Continue IV amio 30 mg per hour. Will continue IV amio until dobutamaine off. Not good candidate for long-term amio of AC due to ETOH cirrhosis and liver failure.   Palliative Care discussions ongoing. Now DNR/DNI with consideration for full comfort care.  Family requesting [employer]  or [id number].   Amy Clegg NP-C  7:49 AM   Patient seen and examined with Tonye Becket, NP. We discussed all aspects of the encounter. I agree with the assessment and plan as stated above.   More alert today and clinically improved on IV dobutamine. Diuresing well with lasix gtt but still somewhat edematous.  Remains in rate controlled AF on amio. His MELD score is 35.   He is not candidate for home inotropes. We will begin wean of dobutamine today. IV diuresis one mor day. We will then wean dobutamine to off and switch him to oral diuretics. While I don't think this progress will be sustainable in the long run, I do think it will give his caretakers the best chance at keep his fluid  off and keeping him comfortable at least in the short term. I do think getting him back in NSR but be useful but he is not candidate for anticoagulation and thus this is prohibitive. Hopefully we will have him diuresed and off inotropes in the next 2-3 days.  Discussed with him and his brother at the bedside.   Consider PT to mobilize him a bit.   Marlise Fahr,MD 9:45 AM

## 2015-12-14 NOTE — Progress Notes (Signed)
Daily Progress Note   Patient Name: Austin [name]       Date: 12/14/2015 DOB: October 31, 1970  Age: 45 y.o. MRN#: [id number] Attending Physician: Meredeth Ide, MD Primary Care Physician: Aida Puffer, MD Admit Date: 12/04/2015  Reason for Consultation/Follow-up: Hospice Evaluation, Non pain symptom management and Psychosocial/spiritual support  Subjective: "I peed good again last night".   I'm tired today.  I stayed up too late last night.   Austin [name] said he wants to get out of bed and move around.  I told him I would put in a physical therapy consultation for him to help.  Constipation resolved.  Scrotal swelling improved.  Not currently complaining of hip/leg pain.  Enjoying visits from multiple family members.   Length of Stay: 10  Current Medications: Scheduled Meds:  . cetaphil   Topical BID  . magic mouthwash  10 mL Oral QID  . pantoprazole  40 mg Oral BID AC  . potassium chloride  40 mEq Oral Q4H  . senna-docusate  2 tablet Oral Once  . sodium chloride flush  10-40 mL Intracatheter Q12H  . spironolactone  25 mg Oral Daily    Continuous Infusions: . amiodarone 30 mg/hr (12/13/15 1926)  . DOBUTamine 5 mcg/kg/min (12/13/15 1400)  . furosemide (LASIX) infusion 20 mg/hr (12/13/15 2126)    PRN Meds: alum & mag hydroxide-simeth, calcium carbonate, levalbuterol, LORazepam, morphine injection, MUSCLE RUB, ondansetron **OR** ondansetron (ZOFRAN) IV, oxyCODONE-acetaminophen, senna-docusate, sodium chloride flush  Physical Exam        Wd 45 yo male, pallor, appears older than stated age.  He is alert, orientated and not lethargic today. + Jaundice rrr with  systolic murmur resp CTA no increased work of breathing. Abdomen + Hepatomegaly, NT, +BS Extremities +Ted hose, 2- 3 +edema on the  right.  Vital Signs: BP (!) 89/69   Pulse 90   Temp 97.9 F (36.6 C) (Oral)   Resp 15   Ht 6\' 3"  (1.905 m)   Wt 93.8 kg (206 lb 12.7 oz)   SpO2 97%   BMI 25.85 kg/m  SpO2: SpO2: 97 % O2 Device: O2 Device: Nasal Cannula O2 Flow Rate: O2 Flow Rate (L/min): 2 L/min  Intake/output summary:   Intake/Output Summary (Last 24 hours) at 12/14/15 0738 Last data filed at 12/14/15 0700  Gross per 24 hour  Intake          2404.42 ml  Output             4125 ml  Net         -1720.58 ml   LBM: Last BM Date: 12/10/15 Baseline Weight: Weight: 92.1 kg (203 lb 0.7 oz) Most recent weight: Weight: 93.8 kg (206 lb 12.7 oz)       Palliative Assessment/Data:    Flowsheet Rows   Flowsheet Row Most Recent Value  Intake Tab  Referral Department  Hospitalist  Unit at Time of Referral  ICU  Palliative Care Primary Diagnosis  Cardiac  Date Notified  12/08/15  Palliative Care Type  New Palliative care  Reason for referral  Clarify Goals of Care, Psychosocial or Spiritual support  Date of Admission  12/04/15  Date first seen by Palliative Care  12/09/15  # of days Palliative referral response time  1 Day(s)  # of days IP prior to Palliative referral  4  Clinical Assessment  Palliative Performance Scale Score  30%  Pain Max last 24 hours  Not able to report  Pain Min Last 24 hours  Not able to report  Dyspnea Max Last 24 Hours  Not able to report  Dyspnea Min Last 24 hours  Not able to report  Nausea Max Last 24 Hours  Not able to report  Nausea Min Last 24 Hours  Not able to report  Anxiety Max Last 24 Hours  Not able to report  Anxiety Min Last 24 Hours  Not able to report  Other Max Last 24 Hours  Not able to report  Psychosocial & Spiritual Assessment  Palliative Care Outcomes  Patient/Family meeting held?  Yes  Who was at the meeting?  parents  Palliative Care follow-up planned  Yes, Facility      Patient Active Problem List   Diagnosis Date Noted  . Thrush, oral   . Scrotal  swelling   . Slow transit constipation   . Encounter for hospice care discussion   . DNR (do not resuscitate)   . Arthralgia of both lower legs   . Acute on chronic systolic CHF (congestive heart failure) (HCC) 12/10/2015  . Acute respiratory failure with hypoxia (HCC) 12/10/2015  . Acute renal failure superimposed on stage 4 chronic kidney disease (HCC) 12/10/2015  . Portal hypertension (HCC) 12/10/2015  . Coagulopathy (HCC) 12/10/2015  . Hyponatremia 12/10/2015  . Lactic acidosis 12/10/2015  . Edema   . Alcohol abuse, unspecified   . Alcoholic liver damage (HCC)   . Atrial fibrillation (HCC)   . Other abnormal blood chemistry   . Anxiety state   . Goals of care, counseling/discussion   . Palliative care encounter   . Cardiogenic shock (HCC)   . Jaundice   . Elevated LFTs   . Atrial fibrillation with RVR (HCC) 12/04/2015  . Anasarca 12/04/2015  . Alcoholic liver disease (HCC) 12/04/2015  . ARF (acute renal failure) (HCC) 12/04/2015  . Alcohol abuse 12/04/2015  . Acute kidney injury [facility])     Palliative Care Assessment & Plan   Patient Profile: 45 y.o. male  with past medical history of Atrial fibrillation, alcohol abuse, opioid use disorder, systolic heart failure admitted on 12/04/2015 with increasing lower extremity edema, abdominal discomfort. While in the emergency room he was found to be in atrial fibrillation with RVR. He also was very hypotensive requiring a fluid bolus. Since admission he has become sicker and is now with multisystem failure:  Worsening systolic heart function with an ejection fraction of 35%, kidney failure and now is on a dobutamine drip to maintain blood pressure. His last drink of alcohol was 2 days prior to admission  Assessment: Over the last 48 - 72 hours - intake improved, output improved, creatinine improved.  Austin [name] looks stronger and more alert.  He has gotten out of bed to the bedside commode 2x today.   BP remains soft with systolic in the 70s  - 80s.  Per HF Team - Austin [name] will be maximally diuresed in the next day or so. Likely d/c'ing lasix gtt 8/12 and shift to torsemide 100 mg daily.  Dobutamine decreased from 5 to 4 mcg.    Recommendations/Plan:  Current plan is that HF team will direct the gradual change of GTT to PO medications in hope of [name] with his family prior to passing.   If he does poorly after the dobutamine is discontinued his family understands he will transition to comfort measures only.  We recommend no escalation of care.   [facility] = HCPOA, daily updates. [phone]  Low dose ativan PRN for anxiety.  Percocet and Morphine PRN q2 for pain and dyspnea  Heat, Aspercreme for hip / leg pain  Senna/S for constipation   Scrotal Sling per nursing  Magic Mouthwash - thrush  Physical Therapy evaluation ordered.  Come on [place]!   Code Status: DNR.   Prognosis:   Uncertain days to weeks once pressors/ IV gtts are discontinued.  Family understands this.    Discharge Planning:  hospital death vs residential hospice  Care plan was discussed with patient and Austin PicketHCPOA, [name].  Thank you for allowing the Palliative Medicine Team to assist in the care of this patient.   Time In: 1:00 Time Out: 1:25 Total Time 25 Prolonged Time Billed no      Greater than 50%  of this time was spent counseling and coordinating care related to the above assessment and plan.  Algis DownsMarianne York, PA-C Palliative Medicine Pager: [phone]  Please contact Palliative Medicine Team phone at [phone] for questions and concerns.

## 2015-12-14 NOTE — Consult Note (Signed)
[facility] [employer]  Received request from [place] for family interest in [facility]. Chart reviewed and received report from Palliative Medicine PA, Imogene Burn. Met with patient briefly who deferred me to his brother or father. Met with father and sister to answer questions and explain services. Spoke with brother [name] by phone at father's request. All report being hopeful patient will be stable enough to leave hospital by Monday. Their understanding is patient is being slowly weaned off IV lasix and cardiac drips. Provided directions to [facility] and encouraged tour over weekend. Family aware [facility] will continue to monitor and follow up on Monday for possible transfer based on how patient is doing Monday and [employer] availability.   Thank you,  Erling Conte, LCSW [phone]

## 2015-12-15 LAB — CARBOXYHEMOGLOBIN
Carboxyhemoglobin: 1.3 % (ref 0.5–1.5)
Methemoglobin: 0.7 % (ref 0.0–1.5)
O2 SAT: 50.6 %
TOTAL HEMOGLOBIN: 14.9 g/dL (ref 13.5–18.0)

## 2015-12-15 LAB — BASIC METABOLIC PANEL
Anion gap: 12 (ref 5–15)
BUN: 52 mg/dL — AB (ref 6–20)
CHLORIDE: 73 mmol/L — AB (ref 101–111)
CO2: 37 mmol/L — AB (ref 22–32)
CREATININE: 2.21 mg/dL — AB (ref 0.61–1.24)
Calcium: 8.3 mg/dL — ABNORMAL LOW (ref 8.9–10.3)
GFR calc Af Amer: 39 mL/min — ABNORMAL LOW (ref >60)
GFR calc non Af Amer: 34 mL/min — ABNORMAL LOW (ref >60)
Glucose, Bld: 112 mg/dL — ABNORMAL HIGH (ref 65–99)
Potassium: 4.5 mmol/L (ref 3.5–5.1)
SODIUM: 122 mmol/L — AB (ref 135–145)

## 2015-12-15 MED ORDER — AMIODARONE HCL 200 MG PO TABS
200.0000 mg | ORAL_TABLET | Freq: Two times a day (BID) | ORAL | Status: DC
  Administered 2015-12-15 – 2015-12-17 (×5): 200 mg via ORAL
  Filled 2015-12-15 (×5): qty 1

## 2015-12-15 MED ORDER — TORSEMIDE 20 MG PO TABS
80.0000 mg | ORAL_TABLET | Freq: Every day | ORAL | Status: DC
  Administered 2015-12-15 – 2015-12-16 (×2): 80 mg via ORAL
  Filled 2015-12-15 (×2): qty 4

## 2015-12-15 NOTE — Progress Notes (Signed)
[facility]  PROGRESS NOTE  Austin [name] QIO:[id number] DOB: 1971/04/01 DOA: 12/04/2015 PCP: Austin Puffer, MD    Brief HPI:  45 year old male with a history of ongoing alcohol abuse, atrial fibrillation presented to the emergency department on 12/04/2015 with increasing lower extremity edema over several weeks and lower abdominal discomfort. The patient was found to have atrial fibrillation with RVR with initial hypotension with systolic blood pressure in the 80s. The patient was clinically fluid overloaded with anasarca and pulmonary edema. He was ultimately started on an amiodarone and lasix drip. Critical caremedicine was consulted.Cardiology was also consulted.Echocardiogram showed EF 35-40% with diffuse hypokinesis, moderate RV dilation and PAP 45. The patient was noted to have elevated procalcitonin and lactic acid and concerns for right lower lobe infiltrate. He was started on levofloxacin on 12/05/2015. Blood cultures and urine cultures have been negative. MRSA screen was negative. The patient remains critically ill with evidence of cardiac, hepatic, renal, and hematologic failure. Unfortunately, Patient has very poor insight into his clinical situation. Palliative care medicine was consulted, andthe patient and family continue to desirethe full scope of care.     Assessment/Plan:    1. Acute on chronic systolic CHF- patient has evidence of right and left heart failure, as per echo EF 35-40%. Diffuse hypokinesis, moderate right ventricular dilation, pulmonary artery pressure 45. Patient currently on furosemide drip per cardiology. Dobutamine dose reduced to 2.5 today, and plan to stop it tomorrow.Peri [place] urine output  Plan for residential hospice on Monday. 2. Paroxysmal atrial fibrillation with RVR- continue amiodarone  he is not a candidate for anticoagulation secondary to ongoing alcohol use and liver cirrhosis. Patient self discontinued warfarin 2 years ago due to lack of  insurance as he lost his job. 3. Hypokalemia- potassium being replaced. Check bmp in am. 4. Portal hypertension with suspected underlying liver cirrhosis- suspect liver cirrhosis  as initial bilirubin was 12.2, now slowly improved to 7.3. Secondary to alcohol abuse and right heart failure. INR 2.2 5. 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 Will follow BMP in a.m. 6. Hyponatremia- secondary to CHF and liver cirrhosis. Check BMP in a.m. 7. Goals of care- patient seen by palliative care, now DO NOT RESUSCITATE, no escalation of care.    DVT prophylaxis: INR > 2.0 Code Status: DNR Family Communication: No family at bedside Disposition Plan: To be decided   Consultants:  Cardiology  Palliative care  PCCM  Procedures:  Echocardiogram  Antibiotics:  None  Subjective    Patient seen and examined, feels the same, dobutamine dose has been reduced to 2.5 today.  Objective    Objective: Vitals:   12/14/15 2325 12/15/15 0359 12/15/15 0401 12/15/15 0500  BP: (!) 76/65  (!) 84/69   Pulse: 90 (!) 104 96 100  Resp: Temp: 97.5 F (36.4 C) 98 F (36.7 C)    TempSrc: Oral Oral    SpO2: 97% 94% 94%   Weight:  92.5 kg (203 lb 14.8 oz)    Height:        Intake/Output Summary (Last 24 hours) at 12/15/15 1143 Last data filed at 12/15/15 0600  Gross per 24 hour  Intake          1403.71 ml  Output             1225 ml  Net           178.71 ml   [employer]  12/13/15 0416 12/14/15 0350 12/15/15 0359  Weight: 93.4 kg (205 lb 14.6 oz) 93.8 kg (206 lb 12.7 oz) 92.5 kg (203 lb 14.8 oz)    Examination:  General exam: Appears calm and comfortable  Respiratory system: Clear to auscultation. Respiratory effort normal. Cardiovascular system: S1 & S2 heard, Irregular, no murmurs, rubs, gallops or clicks.b/l  2 + pitting edema Gastrointestinal system: Abdomen is nondistended, soft and  nontender. No organomegaly or masses felt. Normal bowel sounds heard. Central nervous system: Alert and oriented. No focal neurological deficits. Extremities: Symmetric 5 x 5 power.    Data Reviewed: I have personally reviewed following labs and imaging studies Basic Metabolic Panel:  Recent Labs Lab 12/11/15 0525 12/12/15 0800 12/13/15 0345 12/14/15 0430 12/15/15 0358  NA 119* 119* 120* 122* 122*  K 3.5 3.5 2.8* 2.9* 4.5  CL 71* 72* 71* 71* 73*  CO2 32 32 36* 39* 37*  GLUCOSE 208* 158* 162* 129* 112*  BUN 64* 69* 63* 54* 52*  CREATININE 3.32* 3.28* 2.80* 2.14* 2.21*  CALCIUM 8.5* 8.3* 8.6* 8.6* 8.3*  MG  --   --   --  1.6*  --    Liver Function Tests:  Recent Labs Lab 12/10/15 1200 12/11/15 0525 12/12/15 0800  AST 60* 55* 53*  ALT 20 22 22   ALKPHOS 84 84 77  BILITOT 8.8* 7.9* 7.3*  PROT 5.4* 5.3* 5.2*  ALBUMIN 2.6* 2.5* 2.3*   No results for input(s): LIPASE, AMYLASE in the last 168 hours.  Recent Labs Lab 12/09/15 0428  AMMONIA 26   CBC:  Recent Labs Lab 12/10/15 0355 12/11/15 0525 12/12/15 0819  WBC 9.2 8.0 8.8  NEUTROABS 7.4  --  7.9*  HGB 14.4 13.8 13.7  HCT 39.3 38.3* 38.1*  MCV 108.0* 109.4* 108.5*  PLT 57* 56* 48*     No results found for this or any previous visit (from the past 240 hour(s)).   Studies: No results found.  Scheduled Meds: . amiodarone  200 mg Oral BID  . cetaphil   Topical BID  . magic mouthwash  10 mL Oral QID  . pantoprazole  40 mg Oral BID AC  . senna-docusate  2 tablet Oral Once  . sodium chloride flush  10-40 mL Intracatheter Q12H  . spironolactone  25 mg Oral Daily  . torsemide  80 mg Oral Daily   Continuous Infusions: . DOBUTamine 2.5 mcg/kg/min (12/15/15 1016)    Time spent: 25 min    [facility],Arrick Dutton S  [facility] Pager [phone]. If 7PM-7AM, please contact night-coverage at [email or web address], Office  [phone]  password [email or web address] 12/15/2015, 11:43 AM  LOS: 11 days

## 2015-12-15 NOTE — Progress Notes (Signed)
Patient Name: Austin [name] Date of Encounter: 12/15/2015  45 yo man with ongoing alcohol abuse, systolic HF and history of AF treated admitted with HF, renal failure and liver failure.   SUBJECTIVE Palliative Care consulted 12/10/15. Pt is now DNR/DNI with no escalation of care.  Plan is to continue current treatment for 24-48 hours with consideration of full comfort care and possible transfer to [facility].   Dobutamine turned down to 4 yesterday. Co-ox 64% -> 51%. Creatinine starting to trend back up. Weight down another 3 pounds.   Feels ok. Denies dyspnea. K up to 4.5. CVP back up to 22  Echo shows moderate LV dysfunction - EF 35-40%. RV severely HK  Moderate PHTN Moderate - severe MR   CURRENT MEDS . cetaphil   Topical BID  . magic mouthwash  10 mL Oral QID  . pantoprazole  40 mg Oral BID AC  . senna-docusate  2 tablet Oral Once  . sodium chloride flush  10-40 mL Intracatheter Q12H  . spironolactone  25 mg Oral Daily   Dobutamine 5 mcg  OBJECTIVE  Vitals:   12/14/15 2325 12/15/15 0359 12/15/15 0401 12/15/15 0500  BP: (!) 76/65  (!) 84/69   Pulse: 90 (!) 104 96 100  Resp: 15 17 16    Temp: 97.5 F (36.4 C) 98 F (36.7 C)    TempSrc: Oral Oral    SpO2: 97% 94% 94%   Weight:  92.5 kg (203 lb 14.8 oz)    Height:        Intake/Output Summary (Last 24 hours) at 12/15/15 0833 Last data filed at 12/15/15 0600  Gross per 24 hour  Intake          1947.31 ml  Output             1225 ml  Net           722.31 ml   Filed Weights   12/13/15 0416 12/14/15 0350 12/15/15 0359  Weight: 93.4 kg (205 lb 14.6 oz) 93.8 kg (206 lb 12.7 oz) 92.5 kg (203 lb 14.8 oz)    PHYSICAL EXAM CVP 22 General: Chronically ill appearing male.   Neuro: Alert and oriented x3 . Moves all extremities spontaneously. Psych: Flat affect. HEENT:  scleral icterus.Temporal wasting  Neck: Supple without bruits. JVP ear  Lungs:  Clear dull at basese Heart: PMI laterally displaced irrg  +s3 3/6  systolic murmur radiating to the axilla and ULSB.  Abdomen: Soft, NT, mildly distended, no HSM. No bruits or masses. +BS  Extremities: No clubbing, cyanosis. 3+ BLE  Edema.  Ted hose in place.  DP/PT/Radials 2+ and equal bilaterally.  EKG: Reviewed, Afib/A flutter 90s  Accessory Clinical Findings  CBC No results for input(s): WBC, NEUTROABS, HGB, HCT, MCV, PLT in the last 72 hours. Basic Metabolic Panel  Recent Labs  12/14/15 0430 12/15/15 0358  NA 122* 122*  K 2.9* 4.5  CL 71* 73*  CO2 39* 37*  GLUCOSE 129* 112*  BUN 54* 52*  CREATININE 2.14* 2.21*  CALCIUM 8.6* 8.3*  MG 1.6*  --    Liver Function Tests No results for input(s): AST, ALT, ALKPHOS, BILITOT, PROT, ALBUMIN in the last 72 hours. No results for input(s): LIPASE, AMYLASE in the last 72 hours. Cardiac Enzymes No results for input(s): CKTOTAL, CKMB, CKMBINDEX, TROPONINI in the last 72 hours. BNP Invalid input(s): POCBNP D-Dimer No results for input(s): DDIMER in the last 72 hours. Hemoglobin A1C No results for input(s): HGBA1C  in the last 72 hours. Fasting Lipid Panel No results for input(s): CHOL, HDL, LDLCALC, TRIG, CHOLHDL, LDLDIRECT in the last 72 hours. Thyroid Function Tests No results for input(s): TSH, T4TOTAL, T3FREE, THYROIDAB in the last 72 hours.  Invalid input(s): FREET3  TELE  afib at 90s   Radiology/Studies  Ct Abdomen Pelvis Wo Contrast  Result Date: 12/04/2015 CLINICAL DATA:  45 year old male with increased lower extremity swelling and abdominal pain. Jaundice. Initial encounter. EXAM: CT ABDOMEN AND PELVIS WITHOUT CONTRAST TECHNIQUE: Multidetector CT imaging of the abdomen and pelvis was performed following the standard protocol without IV contrast. COMPARISON:  CT chest abdomen and pelvis 05/05/2010. FINDINGS: New small to moderate bilateral layering pleural effusions with compressive lower lobe atelectasis. Cardiomegaly appears new since 2012. No pericardial effusion. No acute osseous  abnormality identified. Anasarca, with diffuse abdominal wall and dependent flank subcutaneous stranding. Moderate volume simple appearing free fluid in the pelvis. Small volume free fluid throughout the abdomen, including perihepatic and perisplenic fluid. Negative rectum and urinary bladder. Occasional sigmoid diverticula with no active inflammation. Decompressed left colon. Mild wall thickening of the transverse colon which might be reactive. More normal appearance of the right colon. Negative appendix. Decompressed terminal ileum. Oral contrast in the small bowel, but has not yet reached the TI. Small volume of oral contrast in the distal esophagus. Negative stomach. Decompressed duodenum. Mild hepatomegaly which is new since 2012. No discrete liver lesion identified in the absence of contrast. No intrahepatic or extrahepatic biliary ductal dilatation is evident on this noncontrast exam. Increased density within the gallbladder might be sludge. The main portal vein does not appear enlarged. Negative noncontrast spleen, no splenomegaly. Negative noncontrast pancreas and adrenal glands. Negative noncontrast kidneys. Aortoiliac calcified atherosclerosis noted. No lymphadenopathy identified. IMPRESSION: 1. Anasarca with abdominal and pelvic ascites and layering pleural effusions (small to moderate volume). 2. Cardiomegaly and hepatomegaly are new since 2012. No discrete liver lesion in the absence of contrast. No findings to suggest chronic portal hypertension. The top differential considerations in this clinical setting include heart failure with congestive hepatopathy versus acute or or chronic hepatitis. 3. Calcified aortic atherosclerosis. 4. Suggestion of gallbladder sludge. Electronically Signed   By: Odessa Fleming M.D.   On: 12/04/2015 20:49   Ct Head Wo Contrast  Result Date: 12/07/2015 CLINICAL DATA:  Fall.  History of atrial fibrillation. EXAM: CT HEAD WITHOUT CONTRAST TECHNIQUE: Contiguous axial images were  obtained from the base of the skull through the vertex without intravenous contrast. COMPARISON:  Head CT dated 05/05/2010. FINDINGS: Brain: There is generalized parenchymal atrophy, moderate in degree for age, with commensurate dilatation of the ventricles and sulci. There is no mass, hemorrhage, edema or other evidence of acute parenchymal abnormality. No extra-axial hemorrhage. Vascular: No hyperdense vessel or unexpected calcification. Skull: Negative for fracture or focal lesion. Sinuses/Orbits: No acute findings. Other: None. IMPRESSION: 1. No acute findings.  No intracranial mass, hemorrhage or edema. 2. Atrophy. Electronically Signed   By: Bary Richard M.D.   On: 12/07/2015 21:06   US Abdomen Complete  Result Date: 12/05/2015 CLINICAL DATA:  Patient with elevated LFTs. Anasarca. Alcohol abuse. EXAM: ABDOMEN ULTRASOUND COMPLETE COMPARISON:  CT abdomen pelvis 12/04/2015 FINDINGS: Gallbladder: Sludge within the gallbladder lumen. No gallbladder wall thickening or pericholecystic fluid. Negative sonographic Murphy's sign. Common bile duct: Diameter: 5 mm Liver: Diffusely increased in echogenicity. No focal lesion identified. IVC: No abnormality visualized. Pancreas: Visualized portion unremarkable. Spleen: Size and appearance within normal limits. Right Kidney: Length: 12.0 cm. Echogenicity  within normal limits. No mass or hydronephrosis visualized. Left Kidney: Length: 12.4 cm. Echogenicity within normal limits. No mass or hydronephrosis visualized. Abdominal aorta: No aneurysm visualized. Other findings: Diffuse ascites.  Bilateral pleural effusions. IMPRESSION: Gallbladder sludge. No secondary signs to suggest acute cholecystitis. Hepatic steatosis. Ascites. Bilateral pleural effusions. Electronically Signed   By: Annia Beltrew  Davis M.D.   On: 12/05/2015 17:16   Dg Chest Port 1 View  Addendum Date: 12/08/2015   ADDENDUM REPORT: 12/08/2015 20:03 ADDENDUM: The PICC line terminates in the central SVC.  Electronically Signed   By: Gerome Samavid  Williams III M.D   On: 12/08/2015 20:03   Result Date: 12/08/2015 CLINICAL DATA:  Ongoing alcohol abuse. History of renal and liver failure. EXAM: PORTABLE CHEST 1 VIEW COMPARISON:  December 05, 2015 FINDINGS: No pneumothorax. Diffuse bilateral interstitial opacities consistent with edema. More focal opacity in the right base. Mild cardiomegaly. No other acute abnormalities. IMPRESSION: 1. Pulmonary edema. 2. More focal opacity in the right base could represent superimposed developing infiltrate or asymmetric edema. Recommend clinical correlation and follow-up to resolution. Electronically Signed: By: Gerome Samavid  Williams III M.D On: 12/08/2015 15:54   Dg Chest Port 1 View  Result Date: 12/05/2015 CLINICAL DATA:  45 year old male with cough and anasarca EXAM: PORTABLE CHEST 1 VIEW COMPARISON:  CT dated 05/05/2010 FINDINGS: There is mild cardiomegaly with increased vascular and interstitial prominence compatible with congestive changes and mild interstitial edema. Superimposed pneumonia is not excluded. No focal consolidation, pleural effusion, or pneumothorax. No acute osseous pathology. IMPRESSION: Mild cardiomegaly and congestive changes.  No focal consolidation. Electronically Signed   By: Elgie CollardArash  Radparvar M.D.   On: 12/05/2015 00:39    ASSESSMENT AND PLAN   1. Shock with multisystem organ failure 2. Paroxysmal Afib with RVR - Hx of PAF. Self discontinued warfarin about 2 years ago due to lack of insurance as he lost his job.  3. Acute on chronic systolic HF with biventricular failure - EF 35-40% 4. Acute on chronic renal failure, stage IV 5. Hepatic failure with anasarca 6. ETOH abuse 7. Hyponatremia 8. Hypokalemia  8. DNR/DNI 9. Hypomagnesium  Remains with multi-system organ failure. Co-ox down markedly and volume status much worse with just slight drop in dobutamine. He seems quite inotrope responsive. Unfortunately he is not candidate for outpatient inotrope  therapy. I will cut dobutamine to 2.5 today and plan to stop it tomorrow. He will likely continue to deteriorate quickly despite our efforts to optimize him for comfort.  Will switch to po diuretics and po amio today.   Remains in rate controlled AF on amio. His MELD score is 35.   We have little else to offer him. Probable d/c to [facility] soon. D/w him and his brother.    Jamiel Goncalves,MD 8:33 AM

## 2015-12-16 DIAGNOSIS — L8990 Pressure ulcer of unspecified site, unspecified stage: Secondary | ICD-10-CM | POA: Insufficient documentation

## 2015-12-16 LAB — BASIC METABOLIC PANEL
ANION GAP: 10 (ref 5–15)
BUN: 50 mg/dL — AB (ref 6–20)
CALCIUM: 8 mg/dL — AB (ref 8.9–10.3)
CO2: 37 mmol/L — ABNORMAL HIGH (ref 22–32)
Chloride: 74 mmol/L — ABNORMAL LOW (ref 101–111)
Creatinine, Ser: 2.16 mg/dL — ABNORMAL HIGH (ref 0.61–1.24)
GFR calc Af Amer: 40 mL/min — ABNORMAL LOW (ref >60)
GFR, EST NON AFRICAN AMERICAN: 35 mL/min — AB (ref >60)
Glucose, Bld: 94 mg/dL (ref 65–99)
POTASSIUM: 3.8 mmol/L (ref 3.5–5.1)
SODIUM: 121 mmol/L — AB (ref 135–145)

## 2015-12-16 LAB — CARBOXYHEMOGLOBIN
CARBOXYHEMOGLOBIN: 1.7 % — AB (ref 0.5–1.5)
METHEMOGLOBIN: 0.7 % (ref 0.0–1.5)
O2 Saturation: 56 %
Total hemoglobin: 13.9 g/dL (ref 13.5–18.0)

## 2015-12-16 NOTE — Progress Notes (Signed)
Patient Name: Austin [name] Date of Encounter: 12/16/2015  45 yo man with ongoing alcohol abuse, systolic HF and history of AF treated admitted with HF, renal failure and liver failure.   SUBJECTIVE Palliative Care consulted 12/10/15. Pt is now DNR/DNI with no escalation of care.  Plan is for transfer to [facility] on Monday.  Dobutamine turned down to 2.5 yesterday. Co-ox 51% => 56%. Creatinine stable. Weight stable, now on torsemide.   Feels ok. Denies dyspnea. CVP 15, remains very edematous.  Sodium low at 121.   Echo shows moderate LV dysfunction - EF 35-40%. RV severely HK  Moderate PHTN Moderate - severe MR   CURRENT MEDS . amiodarone  200 mg Oral BID  . cetaphil   Topical BID  . magic mouthwash  10 mL Oral QID  . pantoprazole  40 mg Oral BID AC  . senna-docusate  2 tablet Oral Once  . sodium chloride flush  10-40 mL Intracatheter Q12H  . spironolactone  25 mg Oral Daily  . torsemide  80 mg Oral Daily   Dobutamine 5 mcg  OBJECTIVE  Vitals:   12/16/15 0313 12/16/15 0600 12/16/15 0720 12/16/15 0800  BP: (!) 77/68 (!) 76/62 (!) 85/71 98/65  Pulse: (!) 102 (!) 101  (!) 109  Resp:  14 20 19   Temp: 98.1 F (36.7 C)  97.7 F (36.5 C)   TempSrc: Oral  Oral   SpO2: 100% 98% 99% 95%  Weight: 204 lb 12.9 oz (92.9 kg)     Height:        Intake/Output Summary (Last 24 hours) at 12/16/15 0908 Last data filed at 12/16/15 0800  Gross per 24 hour  Intake           219.97 ml  Output             1950 ml  Net         -1730.03 ml   Filed Weights   12/14/15 0350 12/15/15 0359 12/16/15 0313  Weight: 206 lb 12.7 oz (93.8 kg) 203 lb 14.8 oz (92.5 kg) 204 lb 12.9 oz (92.9 kg)    PHYSICAL EXAM CVP 15 General: Chronically ill appearing male.   Neuro: Alert and oriented x3 . Moves all extremities spontaneously. Psych: Flat affect. HEENT:  scleral icterus.Temporal wasting  Neck: Supple without bruits. JVP ear  Lungs:  Clear dull at basese Heart: PMI laterally displaced  irrg  +s3 3/6 systolic murmur radiating to the axilla and ULSB.  Abdomen: Soft, NT, mildly distended, no HSM. No bruits or masses. +BS  Extremities: No clubbing, cyanosis. 3+ BLE  Edema.  Ted hose in place.  DP/PT/Radials 2+ and equal bilaterally.  EKG: Reviewed, Afib 100s  Accessory Clinical Findings  CBC No results for input(s): WBC, NEUTROABS, HGB, HCT, MCV, PLT in the last 72 hours. Basic Metabolic Panel  Recent Labs  12/14/15 0430 12/15/15 0358 12/16/15 0311  NA 122* 122* 121*  K 2.9* 4.5 3.8  CL 71* 73* 74*  CO2 39* 37* 37*  GLUCOSE 129* 112* 94  BUN 54* 52* 50*  CREATININE 2.14* 2.21* 2.16*  CALCIUM 8.6* 8.3* 8.0*  MG 1.6*  --   --    Liver Function Tests No results for input(s): AST, ALT, ALKPHOS, BILITOT, PROT, ALBUMIN in the last 72 hours. No results for input(s): LIPASE, AMYLASE in the last 72 hours. Cardiac Enzymes No results for input(s): CKTOTAL, CKMB, CKMBINDEX, TROPONINI in the last 72 hours. BNP Invalid input(s): POCBNP D-Dimer No results  for input(s): DDIMER in the last 72 hours. Hemoglobin A1C No results for input(s): HGBA1C in the last 72 hours. Fasting Lipid Panel No results for input(s): CHOL, HDL, LDLCALC, TRIG, CHOLHDL, LDLDIRECT in the last 72 hours. Thyroid Function Tests No results for input(s): TSH, T4TOTAL, T3FREE, THYROIDAB in the last 72 hours.  Invalid input(s): FREET3  TELE  afib at 90s   Radiology/Studies  Ct Abdomen Pelvis Wo Contrast  Result Date: 12/04/2015 CLINICAL DATA:  44 year old male with increased lower extremity swelling and abdominal pain. Jaundice. Initial encounter. EXAM: CT ABDOMEN AND PELVIS WITHOUT CONTRAST TECHNIQUE: Multidetector CT imaging of the abdomen and pelvis was performed following the standard protocol without IV contrast. COMPARISON:  CT chest abdomen and pelvis 05/05/2010. FINDINGS: New small to moderate bilateral layering pleural effusions with compressive lower lobe atelectasis. Cardiomegaly appears  new since 2012. No pericardial effusion. No acute osseous abnormality identified. Anasarca, with diffuse abdominal wall and dependent flank subcutaneous stranding. Moderate volume simple appearing free fluid in the pelvis. Small volume free fluid throughout the abdomen, including perihepatic and perisplenic fluid. Negative rectum and urinary bladder. Occasional sigmoid diverticula with no active inflammation. Decompressed left colon. Mild wall thickening of the transverse colon which might be reactive. More normal appearance of the right colon. Negative appendix. Decompressed terminal ileum. Oral contrast in the small bowel, but has not yet reached the TI. Small volume of oral contrast in the distal esophagus. Negative stomach. Decompressed duodenum. Mild hepatomegaly which is new since 2012. No discrete liver lesion identified in the absence of contrast. No intrahepatic or extrahepatic biliary ductal dilatation is evident on this noncontrast exam. Increased density within the gallbladder might be sludge. The main portal vein does not appear enlarged. Negative noncontrast spleen, no splenomegaly. Negative noncontrast pancreas and adrenal glands. Negative noncontrast kidneys. Aortoiliac calcified atherosclerosis noted. No lymphadenopathy identified. IMPRESSION: 1. Anasarca with abdominal and pelvic ascites and layering pleural effusions (small to moderate volume). 2. Cardiomegaly and hepatomegaly are new since 2012. No discrete liver lesion in the absence of contrast. No findings to suggest chronic portal hypertension. The top differential considerations in this clinical setting include heart failure with congestive hepatopathy versus acute or or chronic hepatitis. 3. Calcified aortic atherosclerosis. 4. Suggestion of gallbladder sludge. Electronically Signed   By: Odessa Fleming M.D.   On: 12/04/2015 20:49   Ct Head Wo Contrast  Result Date: 12/07/2015 CLINICAL DATA:  Fall.  History of atrial fibrillation. EXAM: CT HEAD  WITHOUT CONTRAST TECHNIQUE: Contiguous axial images were obtained from the base of the skull through the vertex without intravenous contrast. COMPARISON:  Head CT dated 05/05/2010. FINDINGS: Brain: There is generalized parenchymal atrophy, moderate in degree for age, with commensurate dilatation of the ventricles and sulci. There is no mass, hemorrhage, edema or other evidence of acute parenchymal abnormality. No extra-axial hemorrhage. Vascular: No hyperdense vessel or unexpected calcification. Skull: Negative for fracture or focal lesion. Sinuses/Orbits: No acute findings. Other: None. IMPRESSION: 1. No acute findings.  No intracranial mass, hemorrhage or edema. 2. Atrophy. Electronically Signed   By: Bary Richard M.D.   On: 12/07/2015 21:06   US Abdomen Complete  Result Date: 12/05/2015 CLINICAL DATA:  Patient with elevated LFTs. Anasarca. Alcohol abuse. EXAM: ABDOMEN ULTRASOUND COMPLETE COMPARISON:  CT abdomen pelvis 12/04/2015 FINDINGS: Gallbladder: Sludge within the gallbladder lumen. No gallbladder wall thickening or pericholecystic fluid. Negative sonographic Murphy's sign. Common bile duct: Diameter: 5 mm Liver: Diffusely increased in echogenicity. No focal lesion identified. IVC: No abnormality visualized. Pancreas: Visualized  portion unremarkable. Spleen: Size and appearance within normal limits. Right Kidney: Length: 12.0 cm. Echogenicity within normal limits. No mass or hydronephrosis visualized. Left Kidney: Length: 12.4 cm. Echogenicity within normal limits. No mass or hydronephrosis visualized. Abdominal aorta: No aneurysm visualized. Other findings: Diffuse ascites.  Bilateral pleural effusions. IMPRESSION: Gallbladder sludge. No secondary signs to suggest acute cholecystitis. Hepatic steatosis. Ascites. Bilateral pleural effusions. Electronically Signed   By: Annia Belt M.D.   On: 12/05/2015 17:16   Dg Chest Port 1 View  Addendum Date: 12/08/2015   ADDENDUM REPORT: 12/08/2015 20:03  ADDENDUM: The PICC line terminates in the central SVC. Electronically Signed   By: Gerome Sam III M.D   On: 12/08/2015 20:03   Result Date: 12/08/2015 CLINICAL DATA:  Ongoing alcohol abuse. History of renal and liver failure. EXAM: PORTABLE CHEST 1 VIEW COMPARISON:  December 05, 2015 FINDINGS: No pneumothorax. Diffuse bilateral interstitial opacities consistent with edema. More focal opacity in the right base. Mild cardiomegaly. No other acute abnormalities. IMPRESSION: 1. Pulmonary edema. 2. More focal opacity in the right base could represent superimposed developing infiltrate or asymmetric edema. Recommend clinical correlation and follow-up to resolution. Electronically Signed: By: Gerome Sam III M.D On: 12/08/2015 15:54   Dg Chest Port 1 View  Result Date: 12/05/2015 CLINICAL DATA:  45 year old male with cough and anasarca EXAM: PORTABLE CHEST 1 VIEW COMPARISON:  CT dated 05/05/2010 FINDINGS: There is mild cardiomegaly with increased vascular and interstitial prominence compatible with congestive changes and mild interstitial edema. Superimposed pneumonia is not excluded. No focal consolidation, pleural effusion, or pneumothorax. No acute osseous pathology. IMPRESSION: Mild cardiomegaly and congestive changes.  No focal consolidation. Electronically Signed   By: Elgie Collard M.D.   On: 12/05/2015 00:39    ASSESSMENT AND PLAN   1. Shock with multisystem organ failure 2. Persistent atrial fibrillation - Hx of PAF. Self discontinued warfarin about 2 years ago due to lack of insurance as he lost his job.  3. Acute on chronic systolic HF with biventricular failure - EF 35-40% 4. Acute on chronic renal failure, stage IV 5. Hepatic failure with anasarca 6. ETOH abuse 7. Hyponatremia 8. Hypokalemia  8. DNR/DNI 9. Hypomagnesium  Remains with multi-system organ failure. He remains volume overloaded on exam but weight is stable.  He is now on po torsemide and dobutamine decreased to 2.5.   Co-ox up a bit at 56%. He seems quite inotrope responsive. Unfortunately, he is not candidate for outpatient inotrope therapy.  He is now planned for residential hospice hopefully on Monday.  I am going to stop dobutamine today, will not recheck co-ox tomorrow as this will not affect plan (not going to be able to get outpatient inotropes).  He will likely deteriorate relatively quickly despite our efforts to optimize him for comfort.  Remains in rate controlled AF on amio. His MELD score is 35.   We have little else to offer him. Will discharge to [facility] tomorrow if stable enough. D/w him and his brother.    Christiann Hagerty,MD 12/16/2015 9:08 AM

## 2015-12-16 NOTE — Progress Notes (Signed)
[facility]  PROGRESS NOTE  Renae Fickleravis S Baiza ZOX:[id number] DOB: 1970-05-20 DOA: 12/04/2015 PCP: Aida PufferLITTLE,JAMES, MD    Brief HPI:  45 year old male with a history of ongoing alcohol abuse, atrial fibrillation presented to the emergency department on 12/04/2015 with increasing lower extremity edema over several weeks and lower abdominal discomfort. The patient was found to have atrial fibrillation with RVR with initial hypotension with systolic blood pressure in the 80s. The patient was clinically fluid overloaded with anasarca and pulmonary edema. He was ultimately started on an amiodarone and lasix drip. Critical caremedicine was consulted.Cardiology was also consulted.Echocardiogram showed EF 35-40% with diffuse hypokinesis, moderate RV dilation and PAP 45. The patient was noted to have elevated procalcitonin and lactic acid and concerns for right lower lobe infiltrate. He was started on levofloxacin on 12/05/2015. Blood cultures and urine cultures have been negative. MRSA screen was negative. The patient remains critically ill with evidence of cardiac, hepatic, renal, and hematologic failure. Unfortunately, Patient has very poor insight into his clinical situation. Palliative care medicine was consulted, andthe patient and family continue to desirethe full scope of care.     Assessment/Plan:    1. Acute on chronic systolic CHF- patient has evidence of right and left heart failure, as per echo EF 35-40%. Diffuse hypokinesis, moderate right ventricular dilation, pulmonary artery pressure 45. Patient currently on furosemide drip per cardiology. Dobutamine has been stopped  today, and plan to stop it tomorrow.Peri [place]. Good urine output  Plan for residential hospice on Monday. 2. Paroxysmal atrial fibrillation with RVR- continue amiodarone  he is not a candidate for anticoagulation secondary to ongoing alcohol use and liver cirrhosis. Patient self discontinued warfarin 2 years ago due to lack of  insurance as he lost his job. 3. Hypokalemia- potassium replete. 4. Portal hypertension with suspected underlying liver cirrhosis- suspect liver cirrhosis  as initial bilirubin was 12.2, now slowly improved to 7.3. Secondary to alcohol abuse and right heart failure. INR 2.2 5. 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 Will follow BMP in a.m. 6. Hyponatremia- secondary to CHF and liver cirrhosis. Check BMP in a.m. 7. Goals of care- patient seen by palliative care, now DO NOT RESUSCITATE, no escalation of care.    DVT prophylaxis: INR > 2.0 Code Status: DNR Family Communication: No family at bedside Disposition Plan: [facility] on Monday   Consultants:  Cardiology  Palliative care  PCCM  Procedures:  Echocardiogram  Antibiotics:  None  Subjective    Patient seen and examined, feels the same, dobutamine has been stopped today.  Objective    Objective: Vitals:   12/16/15 0900 12/16/15 1000 12/16/15 1100 12/16/15 1111  BP:    92/78  Pulse: (!) 102 73 (!) 111 (!) 104  Resp: 17 16 17 14   Temp:    97.6 F (36.4 C)  TempSrc:    Oral  SpO2: 95% 98% 97% 96%  Weight:      Height:        Intake/Output Summary (Last 24 hours) at 12/16/15 1243 Last data filed at 12/16/15 1000  Gross per 24 hour  Intake           345.22 ml  Output             1950 ml  Net         -1604.78 ml   Filed Weights   12/14/15 0350 12/15/15 0359 12/16/15 0313  Weight: 93.8 kg (206 lb 12.7 oz)  92.5 kg (203 lb 14.8 oz) 92.9 kg (204 lb 12.9 oz)    Examination:  General exam: Appears calm and comfortable  Respiratory system: Clear to auscultation. Respiratory effort normal. Cardiovascular system: S1 & S2 heard, Irregular, no murmurs, rubs, gallops or clicks.b/l  2 + pitting edema Gastrointestinal system: Abdomen is nondistended, soft and nontender. No organomegaly or masses felt. Normal bowel sounds  heard. Central nervous system: Alert and oriented. No focal neurological deficits. Extremities: Symmetric 5 x 5 power.    Data Reviewed: I have personally reviewed following labs and imaging studies Basic Metabolic Panel:  Recent Labs Lab 12/12/15 0800 12/13/15 0345 12/14/15 0430 12/15/15 0358 12/16/15 0311  NA 119* 120* 122* 122* 121*  K 3.5 2.8* 2.9* 4.5 3.8  CL 72* 71* 71* 73* 74*  CO2 32 36* 39* 37* 37*  GLUCOSE 158* 162* 129* 112* 94  BUN 69* 63* 54* 52* 50*  CREATININE 3.28* 2.80* 2.14* 2.21* 2.16*  CALCIUM 8.3* 8.6* 8.6* 8.3* 8.0*  MG  --   --  1.6*  --   --    Liver Function Tests:  Recent Labs Lab 12/10/15 1200 12/11/15 0525 12/12/15 0800  AST 60* 55* 53*  ALT ALKPHOS 84 84 77  BILITOT 8.8* 7.9* 7.3*  PROT 5.4* 5.3* 5.2*  ALBUMIN 2.6* 2.5* 2.3*   No results for input(s): LIPASE, AMYLASE in the last 168 hours. No results for input(s): AMMONIA in the last 168 hours. CBC:  Recent Labs Lab 12/10/15 0355 12/11/15 0525 12/12/15 0819  WBC 9.2 8.0 8.8  NEUTROABS 7.4  --  7.9*  HGB 14.4 13.8 13.7  HCT 39.3 38.3* 38.1*  MCV 108.0* 109.4* 108.5*  PLT 57* 56* 48*     No results found for this or any previous visit (from the past 240 hour(s)).   Studies: No results found.  Scheduled Meds: . amiodarone  200 mg Oral BID  . cetaphil   Topical BID  . magic mouthwash  10 mL Oral QID  . pantoprazole  40 mg Oral BID AC  . senna-docusate  2 tablet Oral Once  . sodium chloride flush  10-40 mL Intracatheter Q12H  . spironolactone  25 mg Oral Daily  . torsemide  80 mg Oral Daily   Continuous Infusions:    Time spent: 25 min    [facility]  [facility] Pager [phone]. If 7PM-7AM, please contact night-coverage at [email or web address], Office  [phone]  password [email or web address] 12/16/2015, 12:43 PM  LOS: 12 days

## 2015-12-16 NOTE — Progress Notes (Signed)
Stopped by to see pt and  brother [name] per [name] Germain's request. Pt in no acute distress. Nervous about coming off dobutamine. Updated pt and family on clinical condition. [facility]. See that [facility] has started admission process. Palliative Medicine Team will FU 12/17/15. Eduard RouxSarah Ewart Carrera, ANP

## 2015-12-17 LAB — CBC
HCT: 39.4 % (ref 39.0–52.0)
Hemoglobin: 14.3 g/dL (ref 13.0–17.0)
MCH: 37.8 pg — AB (ref 26.0–34.0)
MCHC: 36.3 g/dL — ABNORMAL HIGH (ref 30.0–36.0)
MCV: 104.2 fL — AB (ref 78.0–100.0)
PLATELETS: 56 10*3/uL — AB (ref 150–400)
RBC: 3.78 MIL/uL — AB (ref 4.22–5.81)
RDW: 16.9 % — AB (ref 11.5–15.5)
WBC: 10.8 10*3/uL — AB (ref 4.0–10.5)

## 2015-12-17 LAB — BASIC METABOLIC PANEL
Anion gap: 12 (ref 5–15)
BUN: 52 mg/dL — AB (ref 6–20)
CALCIUM: 8.5 mg/dL — AB (ref 8.9–10.3)
CO2: 38 mmol/L — AB (ref 22–32)
CREATININE: 2.24 mg/dL — AB (ref 0.61–1.24)
Chloride: 73 mmol/L — ABNORMAL LOW (ref 101–111)
GFR calc non Af Amer: 33 mL/min — ABNORMAL LOW (ref >60)
GFR, EST AFRICAN AMERICAN: 38 mL/min — AB (ref >60)
Glucose, Bld: 95 mg/dL (ref 65–99)
Potassium: 3.9 mmol/L (ref 3.5–5.1)
SODIUM: 123 mmol/L — AB (ref 135–145)

## 2015-12-17 LAB — CARBOXYHEMOGLOBIN
CARBOXYHEMOGLOBIN: 2 % — AB (ref 0.5–1.5)
METHEMOGLOBIN: 0.6 % (ref 0.0–1.5)
O2 SAT: 56 %
Total hemoglobin: 14.6 g/dL (ref 13.5–18.0)

## 2015-12-17 MED ORDER — TORSEMIDE 20 MG PO TABS: 80.0000 mg | ORAL_TABLET | Freq: Two times a day (BID) | ORAL | Status: AC

## 2015-12-17 MED ORDER — TORSEMIDE 20 MG PO TABS
80.0000 mg | ORAL_TABLET | Freq: Two times a day (BID) | ORAL | Status: DC
Start: 2015-12-17 — End: 2015-12-17
  Administered 2015-12-17: 80 mg via ORAL
  Filled 2015-12-17: qty 4

## 2015-12-17 MED ORDER — AMIODARONE HCL 200 MG PO TABS: 200.0000 mg | ORAL_TABLET | Freq: Two times a day (BID) | ORAL | Status: AC

## 2015-12-17 MED ORDER — MORPHINE SULFATE (CONCENTRATE) 10 MG /0.5 ML PO SOLN
10.0000 mg | Freq: Four times a day (QID) | ORAL | 0 refills | Status: AC | PRN
Start: 2015-12-17 — End: ?

## 2015-12-17 MED ORDER — MUSCLE RUB 10-15 % EX CREA: 1.0000 "application " | TOPICAL_CREAM | Freq: Two times a day (BID) | CUTANEOUS | 0 refills | Status: AC | PRN

## 2015-12-17 MED ORDER — SPIRONOLACTONE 25 MG PO TABS: 25.0000 mg | ORAL_TABLET | Freq: Every day | ORAL | Status: AC

## 2015-12-17 NOTE — Progress Notes (Signed)
[facility] (Report: [phone]) Anticipated DC date: 12/17/15 Family notified: Brother Transport by: Sharin MonsPTAR   Per MD patient ready for DC to Hospice. RN, patient, patient's family, and facility notified of DC. RN given number for report. DC packet on chart. Ambulance transport requested for patient.   CSW signing off.  Cristobal GoldmannNadia Shamere Campas, [place]WA Clinical Social Worker [phone]

## 2015-12-17 NOTE — Progress Notes (Signed)
Unable to obtain a UA at this time, pts condom catheter clamped since order placed 8/14. Pt has not urinated. RN will continue to monitor until pt d/c

## 2015-12-17 NOTE — Clinical Social Work Note (Signed)
Clinical Social Work Assessment  Patient Details  Name: Austin [name] MRN: [id number] Date of Birth: 1970-06-14  Date of referral:  12/08/15               Reason for consult:  Substance Use/ETOH Abuse, End of Life/Hospice                Permission sought to share information with:  Facility Medical sales representativeContact Representative, Family Supports Permission granted to share information::  No  Name::     Nurse, children'sBarry  Agency[facility]  Relationship::  Brother  Contact Information:     Housing/Transportation Living arrangements for the past 2 months:  Single Family Home (Too lethargic to discuss) Source of Information:  Patient, Medical Team Patient Interpreter Needed:  None Criminal Activity/Legal Involvement Pertinent to Current Situation/Hospitalization:  No - Comment as needed Significant Relationships:  Parents Lives with:  Self Do you feel safe going back to the place where you live?  Yes Need for family participation in patient care:  Yes (Comment)  Care giving concerns:  CSW received referral for residential Hospice placement. CSW spoke with patient and patient's brother, Austin [name] regarding end of life care plan. They would [facility] once the MD deems pt medically stable for transport.   Social Worker assessment / plan:  [facility].  Employment status:  [employer] Insurance information:  Self Pay (Medicaid Pending) PT Recommendations:  Not assessed at this time Information / Referral to community resources:  Residential Substance Abuse Treatment Options, Outpatient Substance Abuse Treatment Options  Patient/Family's Response to care:  Patient and family would like  Pt to get [facility] where family and friends can visit.  Patient/Family's Understanding of and Emotional Response to Diagnosis, Current Treatment, and Prognosis:  Family was struggling with decision to transition pt, especially with his young age, but now state  that it is best for the patient.  Emotional Assessment Appearance:  Appears stated age Attitude/Demeanor/Rapport:  Lethargic Affect (typically observed):  Quiet Orientation:  Oriented to Self, Oriented to Place, Oriented to  Time, Oriented to Situation Alcohol / Substance use:  Alcohol Use Psych involvement (Current and /or in the community):  No (Comment)  Discharge Needs  Concerns to be addressed:  Substance Abuse Concerns Readmission within the last 30 days:  No Current discharge risk:  Lives alone, Substance Abuse Barriers to Discharge:  No Barriers Identified   Austin [name], LCSWA 12/17/2015, 8:01 AM

## 2015-12-17 NOTE — Progress Notes (Signed)
[place] room available for patient this morning. Met with patient's brother [name] to complete transfer paper work. Dr. Orpah Melter to assume care per family request.   RN please leave PICC in place for use at [facility].  RN please call report to [phone].  Please fax discharge summary to [phone].  Thank you,  Erling Conte, LCSW [phone]

## 2015-12-17 NOTE — Progress Notes (Signed)
Discharge instructions given to patients brother, Harvie HeckRandy. No remaining questions. Pt PIV removed. [facility].

## 2015-12-17 NOTE — Progress Notes (Signed)
Patient Name: Austin [name] Date of Encounter: 12/17/2015  45 yo man with ongoing alcohol abuse, systolic HF and history of AF treated admitted with HF, renal failure and liver failure.   SUBJECTIVE Palliative Care consulted 12/10/15. Pt is now DNR/DNI with no escalation of care.  [facility].  Dobutamine turned off yesterday. Co-ox 51% => 56% => 56%.  Creatinine relatively stable.  Out 1.1 L and down 1 lb. Sodium remains low at 123 lbs.   Fatigued appearing. Remains dyspneic. CVP remains elevated at 16-17. K stable.   Echo shows moderate LV dysfunction - EF 35-40%. RV severely HK  Moderate PHTN Moderate - severe MR   CURRENT MEDS . amiodarone  200 mg Oral BID  . cetaphil   Topical BID  . magic mouthwash  10 mL Oral QID  . pantoprazole  40 mg Oral BID AC  . sodium chloride flush  10-40 mL Intracatheter Q12H  . spironolactone  25 mg Oral Daily  . torsemide  80 mg Oral Daily   Dobutamine 5 mcg  OBJECTIVE  Vitals:   12/16/15 2316 12/17/15 0055 12/17/15 0130 12/17/15 0330  BP: (!) 84/67   (!) 82/70  Pulse: 99 (!) 56 85 77  Resp: (!) 21 16 17  (!) 21  Temp: 97.7 F (36.5 C)   97.4 F (36.3 C)  TempSrc: Axillary   Oral  SpO2: 94% 96% 97% 98%  Weight:   203 lb 0.7 oz (92.1 kg)   Height:        Intake/Output Summary (Last 24 hours) at 12/17/15 0726 Last data filed at 12/17/15 0330  Gross per 24 hour  Intake           492.25 ml  Output             1200 ml  Net          -707.75 ml   Filed Weights   12/15/15 0359 12/16/15 0313 12/17/15 0130  Weight: 203 lb 14.8 oz (92.5 kg) 204 lb 12.9 oz (92.9 kg) 203 lb 0.7 oz (92.1 kg)    PHYSICAL EXAM CVP 16-17 General: Chronically ill appearing male.   Neuro: Alert and oriented x3 . Moves all extremities spontaneously. Psych: Flat affect. HEENT: Scleral icterus.Temporal wasting  Neck: Supple without bruits. JVP up to ear  Lungs:  Clear dull at basese Heart: PMI laterally displaced,  Irregularly irregular,  +s3 3/6 systolic murmur radiating to the axilla and ULSB.  Abdomen: Soft, NT, mildly distended, no HSM. No bruits or masses. +BS  Extremities: No clubbing, cyanosis. 2-3+ BLE Edema.  Ted hose in place.  DP/PT/Radials 2+ and equal bilaterally.  EKG: Reviewed personally, Afib 80-90s  Accessory Clinical Findings  CBC  Recent Labs  12/17/15 0330  WBC 10.8*  HGB 14.3  HCT 39.4  MCV 104.2*  PLT 56*   Basic Metabolic Panel  Recent Labs  12/16/15 0311 12/17/15 0330  NA 121* 123*  K 3.8 3.9  CL 74* 73*  CO2 37* 38*  GLUCOSE 94 95  BUN 50* 52*  CREATININE 2.16* 2.24*  CALCIUM 8.0* 8.5*   Liver Function Tests No results for input(s): AST, ALT, ALKPHOS, BILITOT, PROT, ALBUMIN in the last 72 hours. No results for input(s): LIPASE, AMYLASE in the last 72 hours. Cardiac Enzymes No results for input(s): CKTOTAL, CKMB, CKMBINDEX, TROPONINI in the last 72 hours. BNP Invalid input(s): POCBNP D-Dimer No results for input(s): DDIMER in the last 72 hours. Hemoglobin  A1C No results for input(s): HGBA1C in the last 72 hours. Fasting Lipid Panel No results for input(s): CHOL, HDL, LDLCALC, TRIG, CHOLHDL, LDLDIRECT in the last 72 hours. Thyroid Function Tests No results for input(s): TSH, T4TOTAL, T3FREE, THYROIDAB in the last 72 hours.  Invalid input(s): FREET3  TELE  afib at 90s   Radiology/Studies  Ct Abdomen Pelvis Wo Contrast  Result Date: 12/04/2015 CLINICAL DATA:  45 year old male with increased lower extremity swelling and abdominal pain. Jaundice. Initial encounter. EXAM: CT ABDOMEN AND PELVIS WITHOUT CONTRAST TECHNIQUE: Multidetector CT imaging of the abdomen and pelvis was performed following the standard protocol without IV contrast. COMPARISON:  CT chest abdomen and pelvis 05/05/2010. FINDINGS: New small to moderate bilateral layering pleural effusions with compressive lower lobe atelectasis. Cardiomegaly appears new since 2012. No pericardial  effusion. No acute osseous abnormality identified. Anasarca, with diffuse abdominal wall and dependent flank subcutaneous stranding. Moderate volume simple appearing free fluid in the pelvis. Small volume free fluid throughout the abdomen, including perihepatic and perisplenic fluid. Negative rectum and urinary bladder. Occasional sigmoid diverticula with no active inflammation. Decompressed left colon. Mild wall thickening of the transverse colon which might be reactive. More normal appearance of the right colon. Negative appendix. Decompressed terminal ileum. Oral contrast in the small bowel, but has not yet reached the TI. Small volume of oral contrast in the distal esophagus. Negative stomach. Decompressed duodenum. Mild hepatomegaly which is new since 2012. No discrete liver lesion identified in the absence of contrast. No intrahepatic or extrahepatic biliary ductal dilatation is evident on this noncontrast exam. Increased density within the gallbladder might be sludge. The main portal vein does not appear enlarged. Negative noncontrast spleen, no splenomegaly. Negative noncontrast pancreas and adrenal glands. Negative noncontrast kidneys. Aortoiliac calcified atherosclerosis noted. No lymphadenopathy identified. IMPRESSION: 1. Anasarca with abdominal and pelvic ascites and layering pleural effusions (small to moderate volume). 2. Cardiomegaly and hepatomegaly are new since 2012. No discrete liver lesion in the absence of contrast. No findings to suggest chronic portal hypertension. The top differential considerations in this clinical setting include heart failure with congestive hepatopathy versus acute or or chronic hepatitis. 3. Calcified aortic atherosclerosis. 4. Suggestion of gallbladder sludge. Electronically Signed   By: Odessa FlemingH  Hall M.D.   On: 12/04/2015 20:49   Ct Head Wo Contrast  Result Date: 12/07/2015 CLINICAL DATA:  Fall.  History of atrial fibrillation. EXAM: CT HEAD WITHOUT CONTRAST TECHNIQUE:  Contiguous axial images were obtained from the base of the skull through the vertex without intravenous contrast. COMPARISON:  Head CT dated 05/05/2010. FINDINGS: Brain: There is generalized parenchymal atrophy, moderate in degree for age, with commensurate dilatation of the ventricles and sulci. There is no mass, hemorrhage, edema or other evidence of acute parenchymal abnormality. No extra-axial hemorrhage. Vascular: No hyperdense vessel or unexpected calcification. Skull: Negative for fracture or focal lesion. Sinuses/Orbits: No acute findings. Other: None. IMPRESSION: 1. No acute findings.  No intracranial mass, hemorrhage or edema. 2. Atrophy. Electronically Signed   By: Bary RichardStan  Maynard M.D.   On: 12/07/2015 21:06   [place] Abdomen Complete  Result Date: 12/05/2015 CLINICAL DATA:  Patient with elevated LFTs. Anasarca. Alcohol abuse. EXAM: ABDOMEN ULTRASOUND COMPLETE COMPARISON:  CT abdomen pelvis 12/04/2015 FINDINGS: Gallbladder: Sludge within the gallbladder lumen. No gallbladder wall thickening or pericholecystic fluid. Negative sonographic Murphy's sign. Common bile duct: Diameter: 5 mm Liver: Diffusely increased in echogenicity. No focal lesion identified. IVC: No abnormality visualized. Pancreas: Visualized portion unremarkable. Spleen: Size and appearance within normal limits.  Right Kidney: Length: 12.0 cm. Echogenicity within normal limits. No mass or hydronephrosis visualized. Left Kidney: Length: 12.4 cm. Echogenicity within normal limits. No mass or hydronephrosis visualized. Abdominal aorta: No aneurysm visualized. Other findings: Diffuse ascites.  Bilateral pleural effusions. IMPRESSION: Gallbladder sludge. No secondary signs to suggest acute cholecystitis. Hepatic steatosis. Ascites. Bilateral pleural effusions. Electronically Signed   By: Annia Belt M.D.   On: 12/05/2015 17:16   Dg Chest Port 1 View  Addendum Date: 12/08/2015   ADDENDUM REPORT: 12/08/2015 20:03 ADDENDUM: The PICC line terminates  in the central SVC. Electronically Signed   By: Gerome Sam III M.D   On: 12/08/2015 20:03   Result Date: 12/08/2015 CLINICAL DATA:  Ongoing alcohol abuse. History of renal and liver failure. EXAM: PORTABLE CHEST 1 VIEW COMPARISON:  December 05, 2015 FINDINGS: No pneumothorax. Diffuse bilateral interstitial opacities consistent with edema. More focal opacity in the right base. Mild cardiomegaly. No other acute abnormalities. IMPRESSION: 1. Pulmonary edema. 2. More focal opacity in the right base could represent superimposed developing infiltrate or asymmetric edema. Recommend clinical correlation and follow-up to resolution. Electronically Signed: By: Gerome Sam III M.D On: 12/08/2015 15:54   Dg Chest Port 1 View  Result Date: 12/05/2015 CLINICAL DATA:  45 year old male with cough and anasarca EXAM: PORTABLE CHEST 1 VIEW COMPARISON:  CT dated 05/05/2010 FINDINGS: There is mild cardiomegaly with increased vascular and interstitial prominence compatible with congestive changes and mild interstitial edema. Superimposed pneumonia is not excluded. No focal consolidation, pleural effusion, or pneumothorax. No acute osseous pathology. IMPRESSION: Mild cardiomegaly and congestive changes.  No focal consolidation. Electronically Signed   By: Elgie Collard M.D.   On: 12/05/2015 00:39    ASSESSMENT AND PLAN   1. Shock with multisystem organ failure 2. Persistent atrial fibrillation - Hx of PAF. Self discontinued warfarin about 2 years ago due to lack of insurance as he lost his job.  3. Acute on chronic systolic HF with biventricular failure - EF 35-40% 4. Acute on chronic renal failure, stage IV 5. Hepatic failure with anasarca 6. ETOH abuse 7. Hyponatremia 8. Hypokalemia  8. DNR/DNI 9. Hypomagnesium  Labs and course consistent with multi-system organ failure.   He remains volume overloaded on exam but weight is relatively stable. He remains on po torsemide.  Dobutamine stopped 12/16/15. Coox  marginal but stable at 56%. He is NOT a candidate for outpatient inotropic support with substance abuse. He will likely deteriorate relatively quickly despite our efforts to optimize him for comfort.    Remains in rate controlled AF on amio. His MELD score is 35.   Unfortunately, we have little else to offer him.    He is tenuous, though stable for discharge to [facility] today. Our team have discussed with him and his brother.   Should discuss with [facility]. They may prefer PICC line be left in.   Graciella Freer, PA-C 12/17/15   Advanced Heart Failure Team Pager [phone] (M-F; 7a - 4p)  Please contact [facility] for night-coverage after hours (4p -7a ) and weekends on [email or web address]     Patient seen and examined with Otilio Saber, PA-C. We discussed all aspects of the encounter. I agree with the assessment and plan as stated above.   Agree. He has end-stage multi-system organ failure. Plan to d[facility] to [facility]. Discussed with him and his brother.  Dawan Farney,MD 9:42 PM

## 2015-12-17 NOTE — Discharge Summary (Signed)
Physician Discharge Summary  Austin [name]:[id number] DOB: [birth year] DOA: 12/04/2015  PCP: Aida Puffer, MD  Admit date: 12/04/2015 Discharge date: 12/17/2015  Time spent: 25* minutes  Recommendations for Outpatient Follow-up:  1. Patient to be discharged to residential hospice   Discharge Diagnoses:  Principal Problem:   Anasarca Active Problems:   Atrial fibrillation with RVR (HCC)   Alcoholic liver disease (HCC)   ARF (acute renal failure) (HCC)   Alcohol abuse   Acute kidney injury (HCC)   Jaundice   Elevated LFTs   Cardiogenic shock (HCC)   Palliative care encounter   Acute on chronic systolic CHF (congestive heart failure) (HCC)   Acute respiratory failure with hypoxia (HCC)   Acute renal failure superimposed on stage 4 chronic kidney disease (HCC)   Portal hypertension (HCC)   Coagulopathy (HCC)   Hyponatremia   Lactic acidosis   Edema   Alcohol abuse, unspecified   Alcoholic liver damage (HCC)   Atrial fibrillation (HCC)   Other abnormal blood chemistry   Anxiety state   Goals of care, counseling/discussion   DNR (do not resuscitate)   Arthralgia of both lower legs   Thrush, oral   Scrotal swelling   Slow transit constipation   Encounter for hospice care discussion   Pressure ulcer   Discharge Condition: Stable  Diet recommendation: Regular diet  Filed Weights   12/15/15 0359 12/16/15 0313 12/17/15 0130  Weight: 92.5 kg (203 lb 14.8 oz) 92.9 kg (204 lb 12.9 oz) 92.1 kg (203 lb 0.7 oz)    History of present illness:  45 year old male with a history of ongoing alcohol abuse, atrial fibrillation presented to the emergency department on 12/04/2015 with increasing lower extremity edema over several weeks and lower abdominal discomfort. The patient was found to have atrial fibrillation with RVR with initial hypotension with systolic blood pressure in the 80s. The patient was clinically fluid overloaded with anasarca and pulmonary edema. He was ultimately  started on an amiodarone and lasix drip. Critical caremedicine was consulted.Cardiology was also consulted.Echocardiogram showed EF 35-40% with diffuse hypokinesis, moderate RV dilation and PAP 45. The patient was noted to have elevated procalcitonin and lactic acid and concerns for right lower lobe infiltrate. He was started on levofloxacin on 12/05/2015. Blood cultures and urine cultures have been negative. MRSA screen was negative. The patient remains critically ill with evidence of cardiac, hepatic, renal, and hematologic failure. Unfortunately, Patient has very poor insight into his clinical situation. Palliative care medicine was consulted, andthe patient and family continue to desirethe full scope of care  Hospital Course:  1. Acute on chronic systolic CHF- patient has evidence of right and left heart failure, as per echo EF 35-40%. Diffuse hypokinesis, moderate right ventricular dilation, pulmonary artery pressure 45. Patient was  on furosemide drip per cardiology. Dobutamine has been stopped.Will discharge on Torsemide 80 mg po BID 2. Paroxysmal atrial fibrillation with RVR- continue amiodarone  he is not a candidate for anticoagulation secondary to ongoing alcohol use and liver cirrhosis. Patient self discontinued warfarin 2 years ago due to lack of insurance as he lost his job. 3. Hypokalemia- potassium replete. 4. Portal hypertension with suspected underlying liver cirrhosis- suspect liver cirrhosis  as initial bilirubin was 12.2, now slowly improved to 7.3. Secondary to alcohol abuse and right heart failure. INR 2.2 5. Acute on chronic kidney disease-stage 3/4- previous baseline creatinine 1.0-1.1, now creatinine 2.24, increased from  yesterday.  6. Hyponatremia- secondary to CHF and liver cirrhosis. Check BMP in a.m. 7.  Goals of care- patient seen by palliative care, now DO NOT RESUSCITATE, no escalation of care.   Procedures:  None   Consultations:  Cardiology  Discharge  Exam: Vitals:   12/17/15 0800 12/17/15 1000  BP: (!) 85/75   Pulse: (!) 107 (!) 106  Resp: 19 16  Temp: 97.9 F (36.6 C)     General: Appears in no acute distress Cardiovascular: S1S2 RRR Respiratory: Bibasilar crackles  Discharge Instructions   Discharge Instructions    Diet - low sodium heart healthy    Complete by:  As directed   Increase activity slowly    Complete by:  As directed     Current Discharge Medication List    START taking these medications   Details  amiodarone (PACERONE) 200 MG tablet Take 1 tablet (200 mg total) by mouth 2 (two) times daily.    Menthol-Methyl Salicylate (MUSCLE RUB) 10-15 % CREA Apply 1 application topically 2 (two) times daily as needed for muscle pain. Refills: 0    Morphine Sulfate (MORPHINE CONCENTRATE) 10 mg / 0.5 ml concentrated solution Take 0.5 mLs (10 mg total) by mouth every 6 (six) hours as needed for severe pain. Qty: 15 mL, Refills: 0    spironolactone (ALDACTONE) 25 MG tablet Take 1 tablet (25 mg total) by mouth daily.    torsemide (DEMADEX) 20 MG tablet Take 4 tablets (80 mg total) by mouth 2 (two) times daily.       Allergies  Allergen Reactions  . Penicillins     Has patient had a PCN reaction causing immediate rash, facial/tongue/throat swelling, SOB or lightheadedness with hypotension: YES Has patient had a PCN reaction causing severe rash involving mucus membranes or skin necrosis: NO Has patient had a PCN reaction that required hospitalization NO Has patient had a PCN reaction occurring within the last 10 years: NO If all of the above answers are "NO", then may proceed with Cephalosporin use.      The results of significant diagnostics from this hospitalization (including imaging, microbiology, ancillary and laboratory) are listed below for reference.    Significant Diagnostic Studies: Ct Abdomen Pelvis Wo Contrast  Result Date: 12/04/2015 CLINICAL DATA:  45 year old male with increased lower extremity  swelling and abdominal pain. Jaundice. Initial encounter. EXAM: CT ABDOMEN AND PELVIS WITHOUT CONTRAST TECHNIQUE: Multidetector CT imaging of the abdomen and pelvis was performed following the standard protocol without IV contrast. COMPARISON:  CT chest abdomen and pelvis 05/05/2010. FINDINGS: New small to moderate bilateral layering pleural effusions with compressive lower lobe atelectasis. Cardiomegaly appears new since 2012. No pericardial effusion. No acute osseous abnormality identified. Anasarca, with diffuse abdominal wall and dependent flank subcutaneous stranding. Moderate volume simple appearing free fluid in the pelvis. Small volume free fluid throughout the abdomen, including perihepatic and perisplenic fluid. Negative rectum and urinary bladder. Occasional sigmoid diverticula with no active inflammation. Decompressed left colon. Mild wall thickening of the transverse colon which might be reactive. More normal appearance of the right colon. Negative appendix. Decompressed terminal ileum. Oral contrast in the small bowel, but has not yet reached the TI. Small volume of oral contrast in the distal esophagus. Negative stomach. Decompressed duodenum. Mild hepatomegaly which is new since 2012. No discrete liver lesion identified in the absence of contrast. No intrahepatic or extrahepatic biliary ductal dilatation is evident on this noncontrast exam. Increased density within the gallbladder might be sludge. The main portal vein does not appear enlarged. Negative noncontrast spleen, no splenomegaly. Negative noncontrast pancreas and adrenal glands.  Negative noncontrast kidneys. Aortoiliac calcified atherosclerosis noted. No lymphadenopathy identified. IMPRESSION: 1. Anasarca with abdominal and pelvic ascites and layering pleural effusions (small to moderate volume). 2. Cardiomegaly and hepatomegaly are new since 2012. No discrete liver lesion in the absence of contrast. No findings to suggest chronic portal  hypertension. The top differential considerations in this clinical setting include heart failure with congestive hepatopathy versus acute or or chronic hepatitis. 3. Calcified aortic atherosclerosis. 4. Suggestion of gallbladder sludge. Electronically Signed   By: Odessa FlemingH  Hall M.D.   On: 12/04/2015 20:49   Ct Head Wo Contrast  Result Date: 12/07/2015 CLINICAL DATA:  Fall.  History of atrial fibrillation. EXAM: CT HEAD WITHOUT CONTRAST TECHNIQUE: Contiguous axial images were obtained from the base of the skull through the vertex without intravenous contrast. COMPARISON:  Head CT dated 05/05/2010. FINDINGS: Brain: There is generalized parenchymal atrophy, moderate in degree for age, with commensurate dilatation of the ventricles and sulci. There is no mass, hemorrhage, edema or other evidence of acute parenchymal abnormality. No extra-axial hemorrhage. Vascular: No hyperdense vessel or unexpected calcification. Skull: Negative for fracture or focal lesion. Sinuses/Orbits: No acute findings. Other: None. IMPRESSION: 1. No acute findings.  No intracranial mass, hemorrhage or edema. 2. Atrophy. Electronically Signed   By: Bary RichardStan  Maynard M.D.   On: 12/07/2015 21:06   [place] Abdomen Complete  Result Date: 12/05/2015 CLINICAL DATA:  Patient with elevated LFTs. Anasarca. Alcohol abuse. EXAM: ABDOMEN ULTRASOUND COMPLETE COMPARISON:  CT abdomen pelvis 12/04/2015 FINDINGS: Gallbladder: Sludge within the gallbladder lumen. No gallbladder wall thickening or pericholecystic fluid. Negative sonographic Murphy's sign. Common bile duct: Diameter: 5 mm Liver: Diffusely increased in echogenicity. No focal lesion identified. IVC: No abnormality visualized. Pancreas: Visualized portion unremarkable. Spleen: Size and appearance within normal limits. Right Kidney: Length: 12.0 cm. Echogenicity within normal limits. No mass or hydronephrosis visualized. Left Kidney: Length: 12.4 cm. Echogenicity within normal limits. No mass or hydronephrosis  visualized. Abdominal aorta: No aneurysm visualized. Other findings: Diffuse ascites.  Bilateral pleural effusions. IMPRESSION: Gallbladder sludge. No secondary signs to suggest acute cholecystitis. Hepatic steatosis. Ascites. Bilateral pleural effusions. Electronically Signed   By: Annia Beltrew  Davis M.D.   On: 12/05/2015 17:16   Dg Chest Port 1 View  Addendum Date: 12/08/2015   ADDENDUM REPORT: 12/08/2015 20:03 ADDENDUM: The PICC line terminates in the central SVC. Electronically Signed   By: Gerome Samavid  Williams III M.D   On: 12/08/2015 20:03   Result Date: 12/08/2015 CLINICAL DATA:  Ongoing alcohol abuse. History of renal and liver failure. EXAM: PORTABLE CHEST 1 VIEW COMPARISON:  December 05, 2015 FINDINGS: No pneumothorax. Diffuse bilateral interstitial opacities consistent with edema. More focal opacity in the right base. Mild cardiomegaly. No other acute abnormalities. IMPRESSION: 1. Pulmonary edema. 2. More focal opacity in the right base could represent superimposed developing infiltrate or asymmetric edema. Recommend clinical correlation and follow-up to resolution. Electronically Signed: By: Gerome Samavid  Williams III M.D On: 12/08/2015 15:54   Dg Chest Port 1 View  Result Date: 12/05/2015 CLINICAL DATA:  45 year old male with cough and anasarca EXAM: PORTABLE CHEST 1 VIEW COMPARISON:  CT dated 05/05/2010 FINDINGS: There is mild cardiomegaly with increased vascular and interstitial prominence compatible with congestive changes and mild interstitial edema. Superimposed pneumonia is not excluded. No focal consolidation, pleural effusion, or pneumothorax. No acute osseous pathology. IMPRESSION: Mild cardiomegaly and congestive changes.  No focal consolidation. Electronically Signed   By: Elgie CollardArash  Radparvar M.D.   On: 12/05/2015 00:39    Microbiology: No results found  for this or any previous visit (from the past 240 hour(s)).   Labs: Basic Metabolic Panel:  Recent Labs Lab 12/13/15 0345 12/14/15 0430  12/15/15 0358 12/16/15 0311 12/17/15 0330  NA 120* 122* 122* 121* 123*  K 2.8* 2.9* 4.5 3.8 3.9  CL 71* 71* 73* 74* 73*  CO2 36* 39* 37* 37* 38*  GLUCOSE 162* 129* 112* 94 95  BUN 63* 54* 52* 50* 52*  CREATININE 2.80* 2.14* 2.21* 2.16* 2.24*  CALCIUM 8.6* 8.6* 8.3* 8.0* 8.5*  MG  --  1.6*  --   --   --    Liver Function Tests:  Recent Labs Lab 12/10/15 1200 12/11/15 0525 12/12/15 0800  AST 60* 55* 53*  ALT 20 22 22   ALKPHOS 84 84 77  BILITOT 8.8* 7.9* 7.3*  PROT 5.4* 5.3* 5.2*  ALBUMIN 2.6* 2.5* 2.3*   No results for input(s): LIPASE, AMYLASE in the last 168 hours. No results for input(s): AMMONIA in the last 168 hours. CBC:  Recent Labs Lab 12/11/15 0525 12/12/15 0819 12/17/15 0330  WBC 8.0 8.8 10.8*  NEUTROABS  --  7.9*  --   HGB 13.8 13.7 14.3  HCT 38.3* 38.1* 39.4  MCV 109.4* 108.5* 104.2*  PLT 56* 48* 56*   Cardiac Enzymes: No results for input(s): CKTOTAL, CKMB, CKMBINDEX, TROPONINI in the last 168 hours. BNP: BNP (last 3 results) No results for input(s): BNP in the last 8760 hours.  ProBNP (last 3 results) No results for input(s): PROBNP in the last 8760 hours.  CBG:  Recent Labs Lab 12/12/15 1636  GLUCAP 131*       Signed:  Meredeth IdeLAMA,Sevannah Madia S MD.  [facility] 12/17/2015, 11:01 AM

## 2016-01-04 DEATH — deceased

## 2017-09-24 IMAGING — CR DG CHEST 1V PORT
1 series · 1 of 1 positions shown · non-contrast
Comparison: CT dated 05/05/2010

CLINICAL DATA: 45-year-old male with cough and anasarca

EXAM:
PORTABLE CHEST 1 VIEW

[portable]
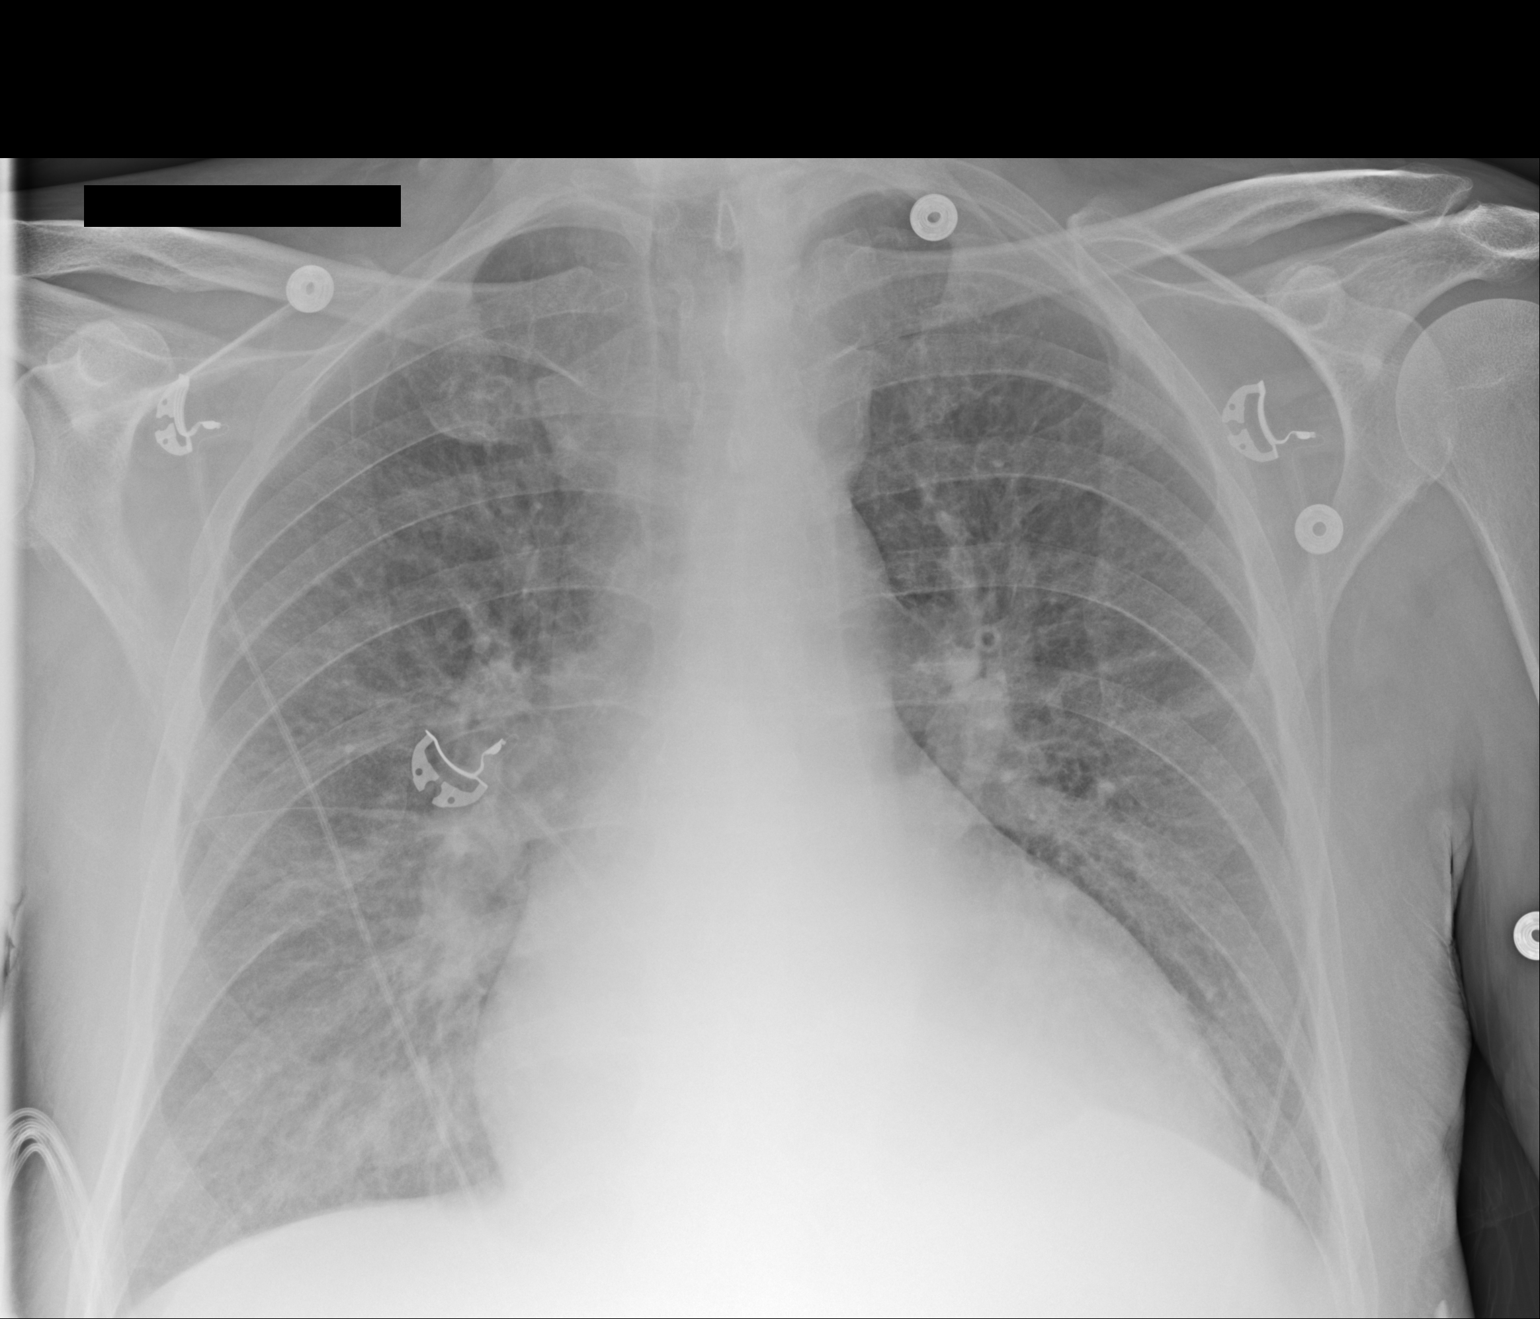

[1 of 1 positions shown; findings below may reference images not displayed]

FINDINGS: There is mild cardiomegaly with increased vascular and interstitial
prominence compatible with congestive changes and mild interstitial
edema. Superimposed pneumonia is not excluded. No focal
consolidation, pleural effusion, or pneumothorax. No acute osseous
pathology.
IMPRESSION: Mild cardiomegaly and congestive changes.  No focal consolidation.

## 2017-09-26 IMAGING — CT CT HEAD W/O CM
5 of 6 series · 17 of 47 positions shown, 18 images · non-contrast
Comparison: Head CT dated 05/05/2010.

CLINICAL DATA: Fall.  History of atrial fibrillation.

EXAM:
CT HEAD WITHOUT CONTRAST
TECHNIQUE: Contiguous axial images were obtained from the base of the skull
through the vertex without intravenous contrast.

[Series 2: head bone · axial · 0.44mm/px · z∈[-157,-45]mm · 7 of 80 slices shown]
[im 8/80  bone]
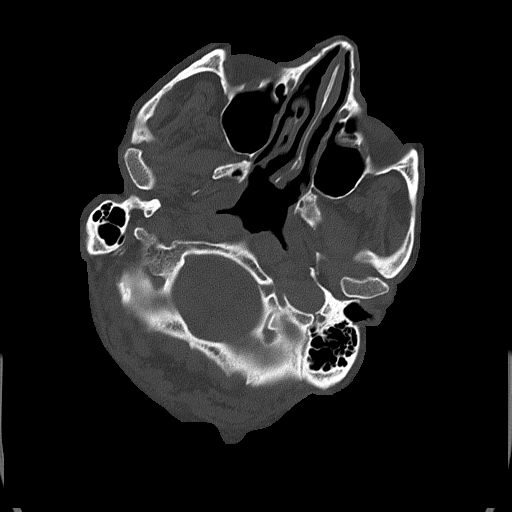
[im 16/80  bone]
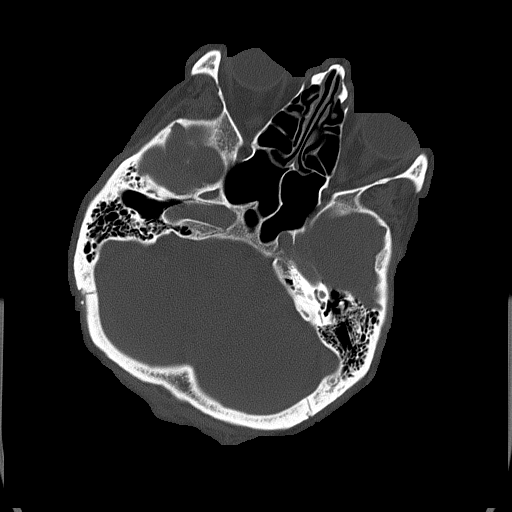
[im 24/80  bone]
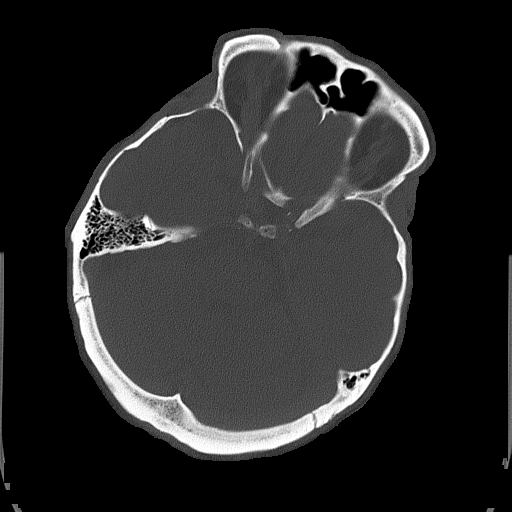
[im 32/80  bone]
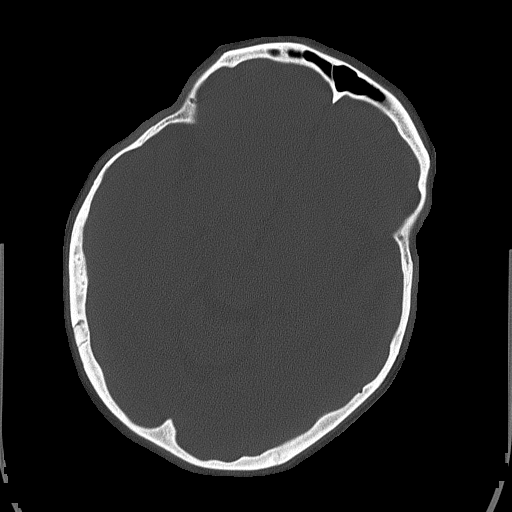
[im 48/80  bone]
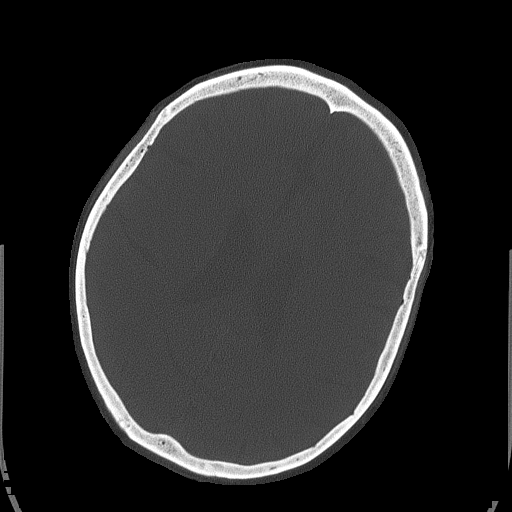
[im 56/80  bone]
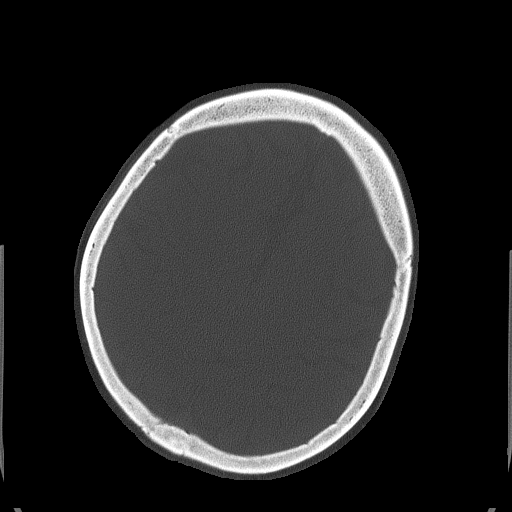
[im 64/80  bone]
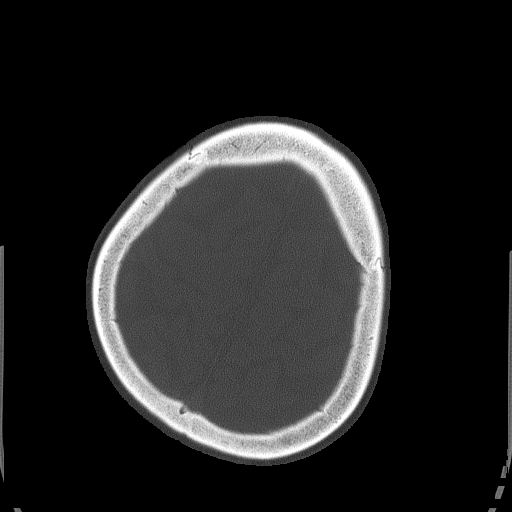

[Series 3: head without · axial · non-contrast · 0.44mm/px · z∈[-121,-71]mm · 2 of 32 slices shown, 3 images]
[im 11/32  brain]
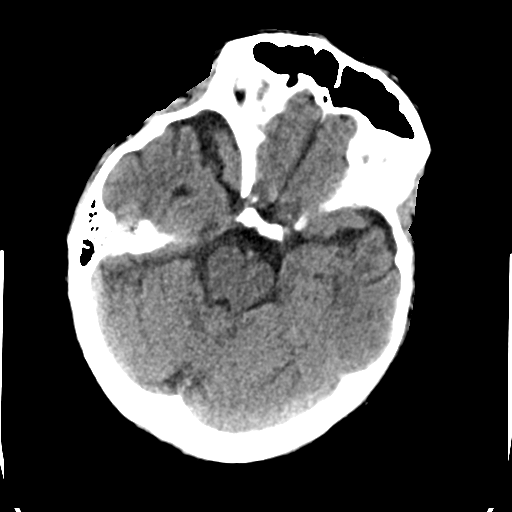
[im 11/32  bone]
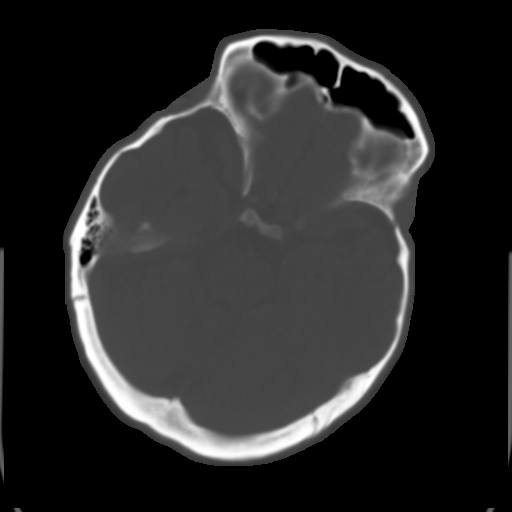
[im 21/32  brain]
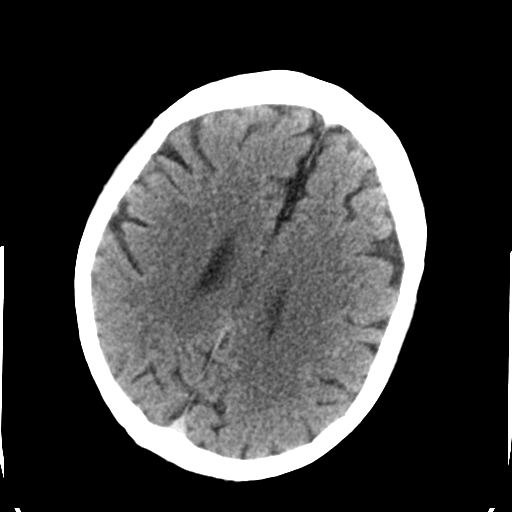

[Series 4: head without cor symmetry · coronal · non-contrast · 0.33mm/px · 3 of 70 slices shown]
[im 24/70  brain]
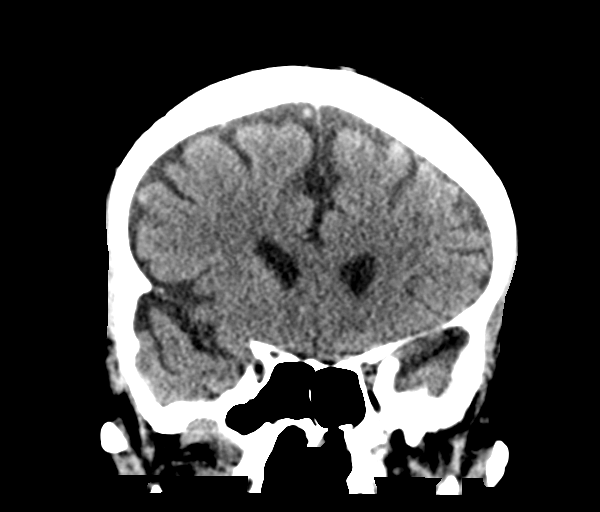
[im 31/70  brain]
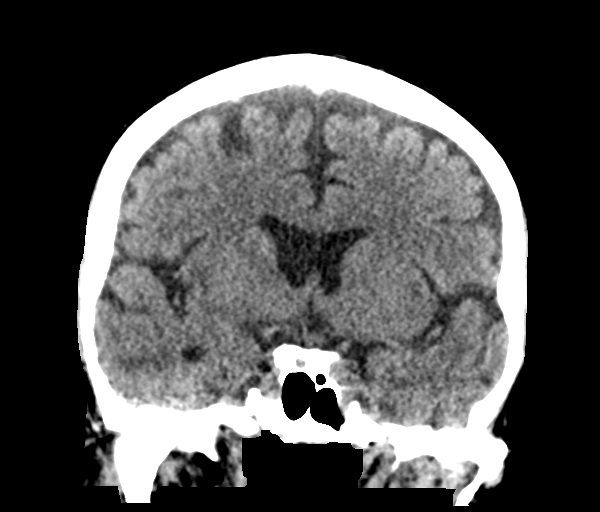
[im 39/70  brain]
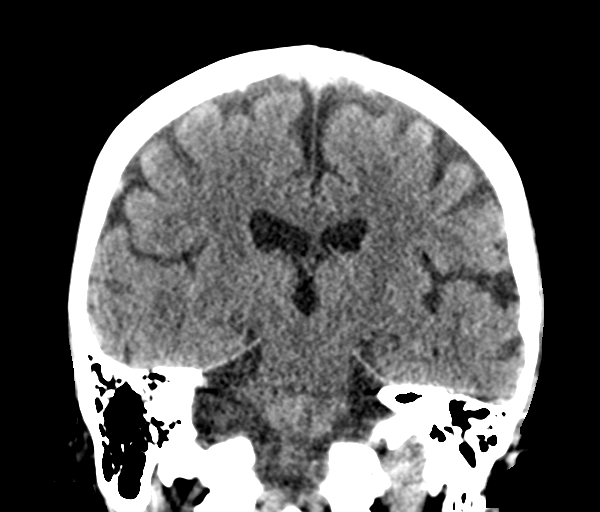

[Series 5: head without sag · sagittal · non-contrast · 0.36mm/px · 3 of 67 slices shown]
[im 23/67  brain]
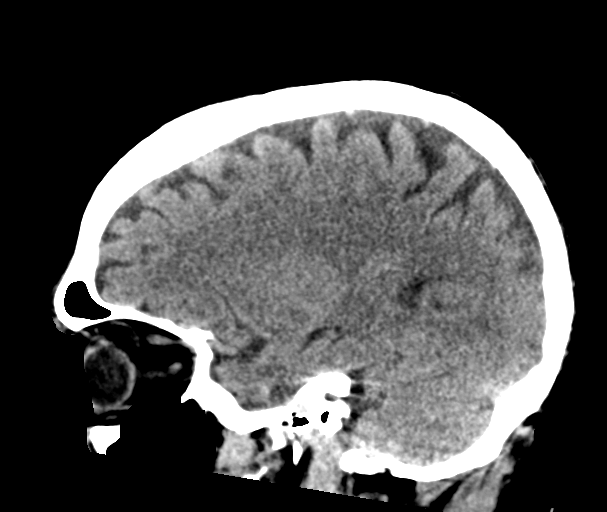
[im 34/67  brain]
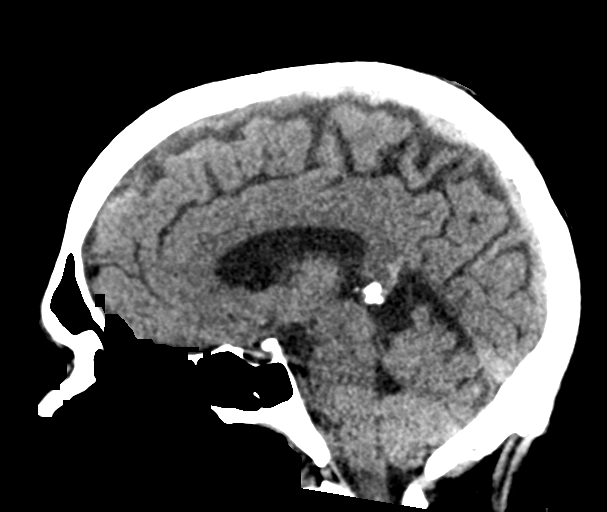
[im 45/67  brain]
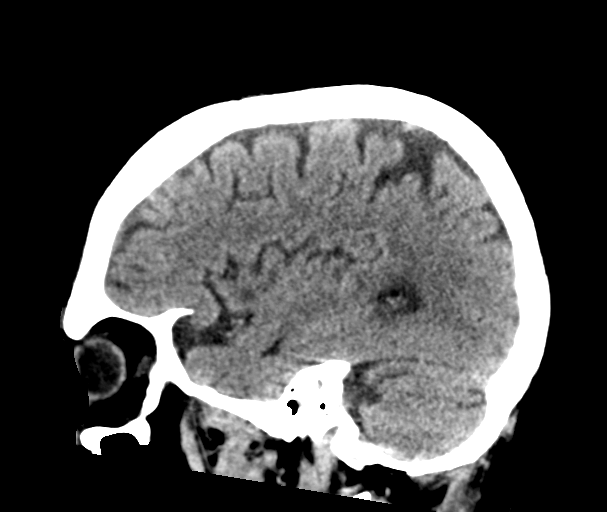

[Series 6: head without ax symmetry · axial · non-contrast · 0.38mm/px · z∈[-139,-84]mm · 2 of 33 slices shown]
[im 11/33  brain]
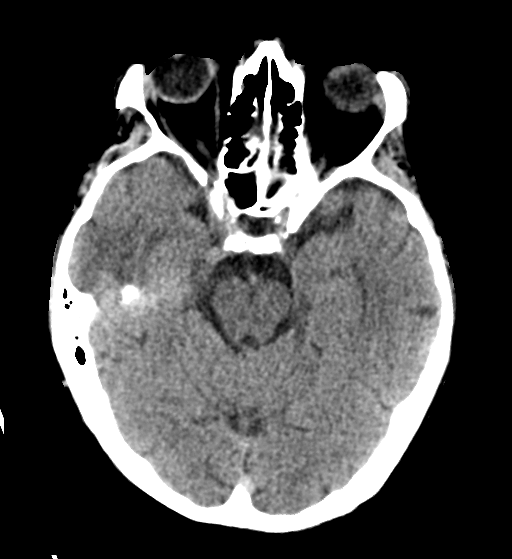
[im 22/33  brain]
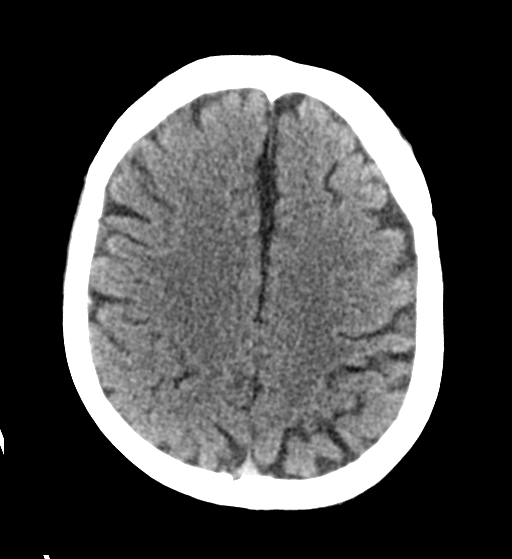

[17 of 47 positions shown; findings below may reference images not displayed]

FINDINGS: Brain: There is generalized parenchymal atrophy, moderate in degree
for age, with commensurate dilatation of the ventricles and sulci.
There is no mass, hemorrhage, edema or other evidence of acute
parenchymal abnormality. No extra-axial hemorrhage.

Vascular: No hyperdense vessel or unexpected calcification.

Skull: Negative for fracture or focal lesion.

Sinuses/Orbits: No acute findings.

Other: None.
IMPRESSION: 1. No acute findings.  No intracranial mass, hemorrhage or edema.
2. Atrophy.
